# Patient Record
Sex: Female | Born: 1970 | Race: Black or African American | Hispanic: No | Marital: Single | Smoking: Former smoker
Health system: Southern US, Community
[De-identification: ages and names within clinical notes are randomized; demographics above are authoritative.]

## PROBLEM LIST (undated history)

## (undated) DIAGNOSIS — I82409 Acute embolism and thrombosis of unspecified deep veins of unspecified lower extremity: Secondary | ICD-10-CM

## (undated) DIAGNOSIS — M1990 Unspecified osteoarthritis, unspecified site: Secondary | ICD-10-CM

## (undated) DIAGNOSIS — I890 Lymphedema, not elsewhere classified: Secondary | ICD-10-CM

## (undated) DIAGNOSIS — I10 Essential (primary) hypertension: Secondary | ICD-10-CM

## (undated) DIAGNOSIS — Z9013 Acquired absence of bilateral breasts and nipples: Secondary | ICD-10-CM

## (undated) DIAGNOSIS — C7951 Secondary malignant neoplasm of bone: Secondary | ICD-10-CM

## (undated) DIAGNOSIS — C801 Malignant (primary) neoplasm, unspecified: Secondary | ICD-10-CM

## (undated) DIAGNOSIS — J45909 Unspecified asthma, uncomplicated: Secondary | ICD-10-CM

## (undated) DIAGNOSIS — C50911 Malignant neoplasm of unspecified site of right female breast: Secondary | ICD-10-CM

## (undated) DIAGNOSIS — Z1731 Human epidermal growth factor receptor 2 positive status: Secondary | ICD-10-CM

## (undated) HISTORY — PX: ABDOMINAL HYSTERECTOMY: SHX81

## (undated) HISTORY — PX: TUBAL LIGATION: SHX77

## (undated) HISTORY — PX: PORTA CATH INSERTION: CATH118285

## (undated) HISTORY — PX: MASTECTOMY: SHX3

## (undated) HISTORY — PX: BRONCHOSCOPY: SUR163

## (undated) NOTE — *Deleted (*Deleted)
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  Headache is a pressure feeling behind the right eye.  She was seen in the ED for syncope as well as a headache.  CT of head was negative.  Oncologist recommended she come in for MRI.  Exam is benign but there is significant tenderness over the right temporalis muscle on the insertion of the paracervical muscles.  Suspect headache is also contraction headache.  Blood pressure is noted to be elevated, but not high enough to account for headache.

---

## 2016-10-26 DIAGNOSIS — C7951 Secondary malignant neoplasm of bone: Secondary | ICD-10-CM | POA: Diagnosis present

## 2020-01-10 ENCOUNTER — Encounter (HOSPITAL_COMMUNITY): Payer: Self-pay

## 2020-01-10 ENCOUNTER — Emergency Department (HOSPITAL_COMMUNITY): Payer: Medicare Other

## 2020-01-10 ENCOUNTER — Other Ambulatory Visit: Payer: Self-pay

## 2020-01-10 ENCOUNTER — Emergency Department (HOSPITAL_COMMUNITY)
Admission: EM | Admit: 2020-01-10 | Discharge: 2020-01-11 | Disposition: A | Discharge status: Home / Self Care | Payer: Medicare Other | Attending: Emergency Medicine | Admitting: Emergency Medicine

## 2020-01-10 DIAGNOSIS — U071 COVID-19: Secondary | ICD-10-CM | POA: Insufficient documentation

## 2020-01-10 DIAGNOSIS — Z853 Personal history of malignant neoplasm of breast: Secondary | ICD-10-CM | POA: Insufficient documentation

## 2020-01-10 DIAGNOSIS — R7989 Other specified abnormal findings of blood chemistry: Secondary | ICD-10-CM | POA: Insufficient documentation

## 2020-01-10 DIAGNOSIS — R0981 Nasal congestion: Secondary | ICD-10-CM

## 2020-01-10 DIAGNOSIS — C7951 Secondary malignant neoplasm of bone: Secondary | ICD-10-CM | POA: Diagnosis not present

## 2020-01-10 DIAGNOSIS — R509 Fever, unspecified: Secondary | ICD-10-CM | POA: Diagnosis present

## 2020-01-10 HISTORY — DX: Acquired absence of bilateral breasts and nipples: Z90.13

## 2020-01-10 HISTORY — DX: Lymphedema, not elsewhere classified: I89.0

## 2020-01-10 HISTORY — DX: Malignant (primary) neoplasm, unspecified: C80.1

## 2020-01-10 HISTORY — DX: Secondary malignant neoplasm of bone: C79.51

## 2020-01-10 HISTORY — DX: Acute embolism and thrombosis of unspecified deep veins of unspecified lower extremity: I82.409

## 2020-01-10 HISTORY — DX: Human epidermal growth factor receptor 2 positive status: Z17.31

## 2020-01-10 HISTORY — DX: Malignant neoplasm of unspecified site of right female breast: C50.911

## 2020-01-10 LAB — COMPREHENSIVE METABOLIC PANEL
ALT: 15 U/L (ref 0–44)
AST: 25 U/L (ref 15–41)
Albumin: 3.9 g/dL (ref 3.5–5.0)
Alkaline Phosphatase: 60 U/L (ref 38–126)
Anion gap: 9 (ref 5–15)
BUN: 10 mg/dL (ref 6–20)
CO2: 25 mmol/L (ref 22–32)
Calcium: 8.9 mg/dL (ref 8.9–10.3)
Chloride: 103 mmol/L (ref 98–111)
Creatinine, Ser: 0.91 mg/dL (ref 0.44–1.00)
GFR calc Af Amer: >60 mL/min (ref >60)
GFR calc non Af Amer: >60 mL/min (ref >60)
Glucose, Bld: 98 mg/dL (ref 70–99)
Potassium: 3.5 mmol/L (ref 3.5–5.1)
Sodium: 137 mmol/L (ref 135–145)
Total Bilirubin: 0.4 mg/dL (ref 0.3–1.2)
Total Protein: 7.8 g/dL (ref 6.5–8.1)

## 2020-01-10 LAB — LACTIC ACID, PLASMA: Lactic Acid, Venous: 0.8 mmol/L (ref 0.5–1.9)

## 2020-01-10 LAB — CBC WITH DIFFERENTIAL/PLATELET
Abs Immature Granulocytes: 0.02 10*3/uL (ref 0.00–0.07)
Basophils Absolute: 0 10*3/uL (ref 0.0–0.1)
Basophils Relative: 0 %
Eosinophils Absolute: 0 10*3/uL (ref 0.0–0.5)
Eosinophils Relative: 1 %
HCT: 35.9 % — ABNORMAL LOW (ref 36.0–46.0)
Hemoglobin: 11.8 g/dL — ABNORMAL LOW (ref 12.0–15.0)
Immature Granulocytes: 0 %
Lymphocytes Relative: 32 %
Lymphs Abs: 1.9 10*3/uL (ref 0.7–4.0)
MCH: 28.6 pg (ref 26.0–34.0)
MCHC: 32.9 g/dL (ref 30.0–36.0)
MCV: 87.1 fL (ref 80.0–100.0)
Monocytes Absolute: 0.3 10*3/uL (ref 0.1–1.0)
Monocytes Relative: 4 %
Neutro Abs: 3.7 10*3/uL (ref 1.7–7.7)
Neutrophils Relative %: 63 %
Platelets: 292 10*3/uL (ref 150–400)
RBC: 4.12 MIL/uL (ref 3.87–5.11)
RDW: 12.4 % (ref 11.5–15.5)
WBC: 6 10*3/uL (ref 4.0–10.5)
nRBC: 0 % (ref 0.0–0.2)

## 2020-01-10 LAB — FERRITIN: Ferritin: 173 ng/mL (ref 11–307)

## 2020-01-10 LAB — TRIGLYCERIDES: Triglycerides: 80 mg/dL (ref <150)

## 2020-01-10 LAB — C-REACTIVE PROTEIN: CRP: 0.9 mg/dL (ref <1.0)

## 2020-01-10 LAB — D-DIMER, QUANTITATIVE: D-Dimer, Quant: 0.77 ug/mL-FEU — ABNORMAL HIGH (ref 0.00–0.50)

## 2020-01-10 LAB — I-STAT BETA HCG BLOOD, ED (MC, WL, AP ONLY): I-stat hCG, quantitative: <5 m[IU]/mL (ref <5)

## 2020-01-10 LAB — LACTATE DEHYDROGENASE: LDH: 184 U/L (ref 98–192)

## 2020-01-10 LAB — FIBRINOGEN: Fibrinogen: 412 mg/dL (ref 210–475)

## 2020-01-10 IMAGING — DX DG CHEST 1V PORT
1 series · 1 of 1 positions shown · non-contrast
Comparison: None.

CLINICAL DATA: Shortness of breath. COVID positive.

EXAM:
PORTABLE CHEST 1 VIEW

[chest ap]
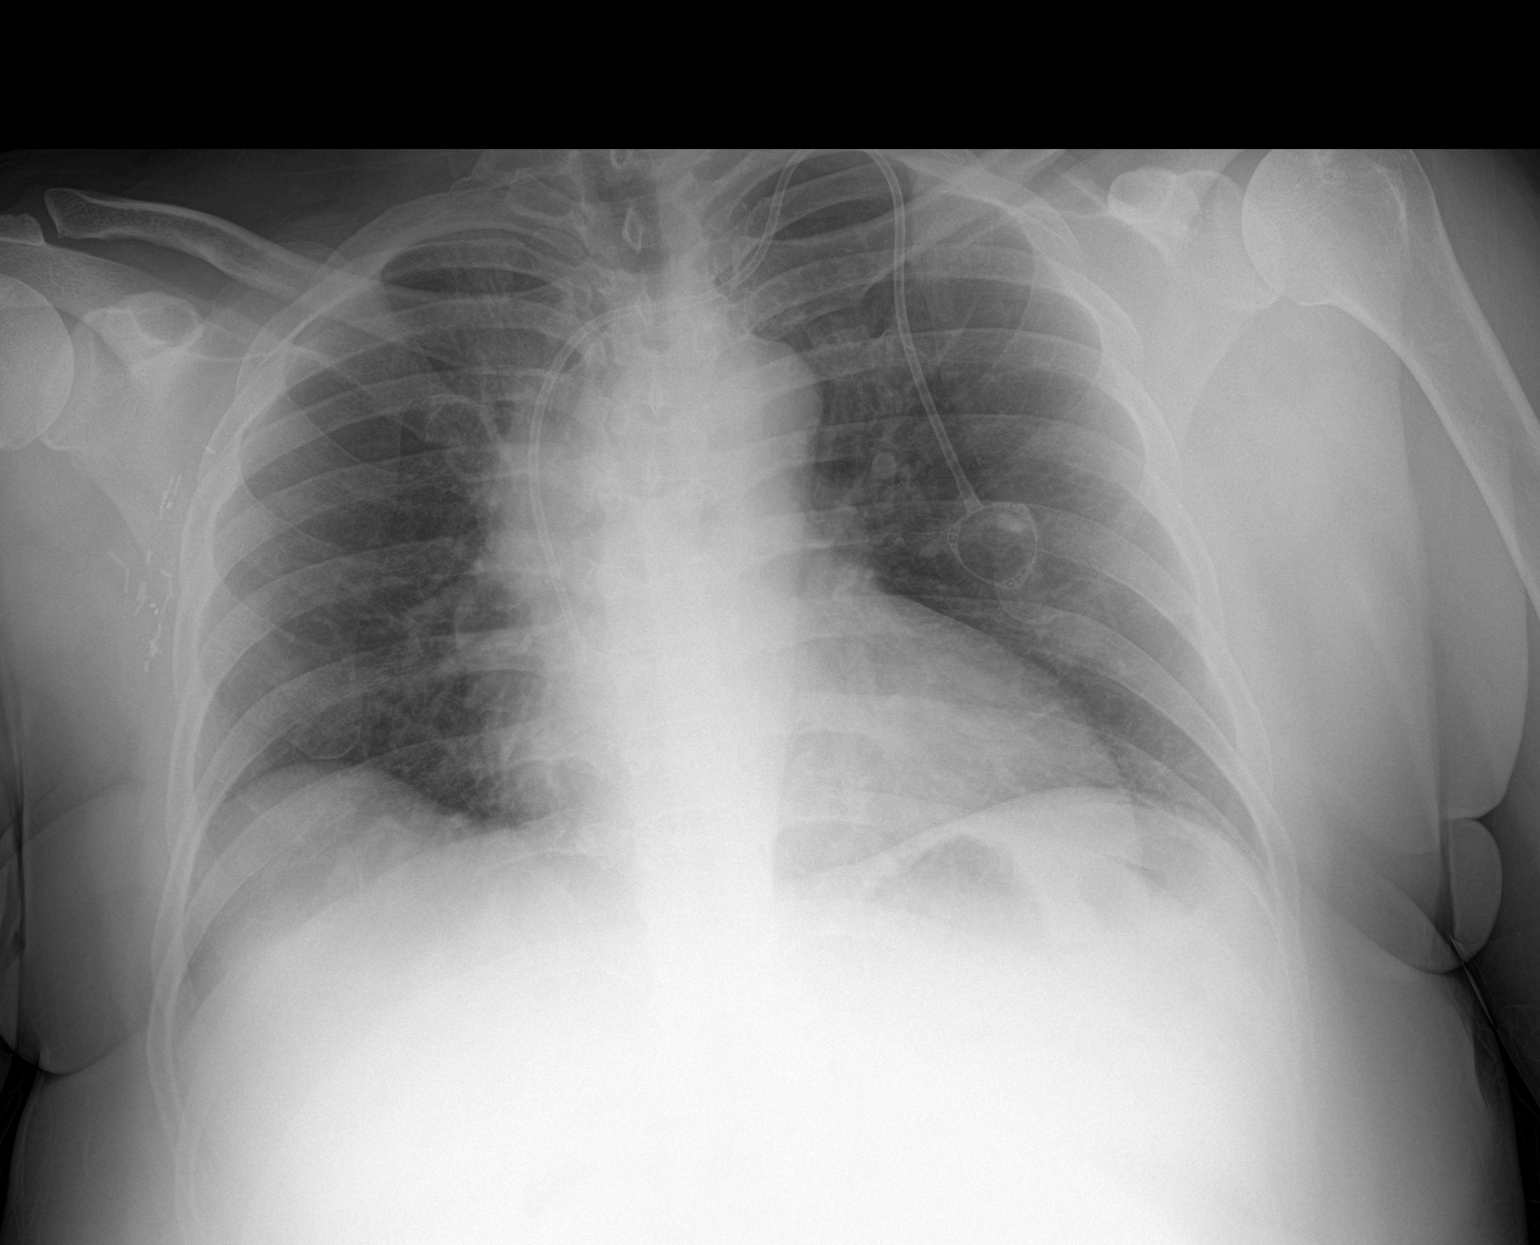

[1 of 1 positions shown; findings below may reference images not displayed]

FINDINGS: A left-sided venous Port-A-Cath is seen with its distal tip noted at
the junction of the superior vena cava and right atrium. There is no
evidence of acute infiltrate, pleural effusion or pneumothorax. The
heart size is within normal limits. There is widening of the
superior mediastinum along the suprahilar region on the right.
Radiopaque surgical clips are seen overlying the right axilla. The
visualized skeletal structures are unremarkable.
IMPRESSION: 1. No evidence of acute cardiopulmonary disease.
2. Widening of the superior mediastinum on the right, as described
above. Correlation with nonemergent chest CT is recommended, as
underlying mediastinal pathology cannot be excluded.

## 2020-01-10 NOTE — ED Provider Notes (Signed)
[facility] DEPT Provider Note   CSN: [id number] Arrival date & time: 01/10/20  2049     History Chief Complaint  Patient presents with  . Fever    covid +  . Cough    Maria Monroe is a 49 y.o. female.  She has a history of breast cancer with metastatic disease to bone.  She is active chemo through [facility].  She visited her mother a week ago and her mother had URI symptoms.  Mother ultimately tested positive for Covid so the patient had a test done on the seventh that was positive.  She had no symptoms until today when she had a fever of 100.0, nasal congestion and a dry cough.  Does not feel short of breath no nausea vomiting diarrhea lack of smell or taste.  She is here at the request of her oncologist.  She is just recently moved to the [place] area.  History of sinus problems  The history is provided by the patient.  Fever Max temp prior to arrival:  100.0 Temp source:  Oral Severity:  Mild Onset quality:  Unable to specify Progression:  Unchanged Chronicity:  New Relieved by:  None tried Worsened by:  Nothing Ineffective treatments:  None tried Associated symptoms: congestion, cough and rhinorrhea   Associated symptoms: no chest pain, no chills, no confusion, no diarrhea, no dysuria, no ear pain, no headaches, no myalgias, no nausea, no rash, no somnolence, no sore throat and no vomiting   Risk factors: immunosuppression, recent travel and sick contacts   Cough Associated symptoms: fever and rhinorrhea   Associated symptoms: no chest pain, no chills, no ear pain, no headaches, no myalgias, no rash, no shortness of breath and no sore throat        Past Medical History:  Diagnosis Date  . Bone metastasis ([place])   . Cancer [facility])    overlapping sites of right breast  . DVT (deep venous thrombosis) ([place])   . H/O bilateral mastectomy   . HER2-positive carcinoma of right breast ([place])   . Lymphedema of both arms     There are no  problems to display for this patient.   History reviewed. No pertinent surgical history.   OB History   No obstetric history on file.     No family history on file.  Social History   Tobacco Use  . Smoking status: Not on file  Substance Use Topics  . Alcohol use: Not on file  . Drug use: Not on file    Home Medications Prior to Admission medications   Not on File    Allergies    Aspirin, Tomato, Citric acid, and Nabumetone  Review of Systems   Review of Systems  Constitutional: Positive for fever. Negative for chills.  HENT: Positive for congestion, rhinorrhea and sneezing. Negative for ear pain and sore throat.   Eyes: Negative for visual disturbance.  Respiratory: Positive for cough. Negative for shortness of breath.   Cardiovascular: Negative for chest pain.  Gastrointestinal: Negative for abdominal pain, diarrhea, nausea and vomiting.  Genitourinary: Negative for dysuria.  Musculoskeletal: Negative for myalgias.  Skin: Negative for rash.  Neurological: Negative for headaches.  Psychiatric/Behavioral: Negative for confusion.    Physical Exam Updated Vital Signs BP 117/85   Pulse (!) 105   Temp 99.8 F (37.7 C) (Oral)   Resp 19   SpO2 94%   Physical Exam Vitals and nursing note reviewed.  Constitutional:      General: She  is not in acute distress.    Appearance: Normal appearance. She is well-developed.  HENT:     Head: Normocephalic and atraumatic.  Eyes:     Conjunctiva/sclera: Conjunctivae normal.  Cardiovascular:     Rate and Rhythm: Normal rate and regular rhythm.     Heart sounds: No murmur.  Pulmonary:     Effort: Pulmonary effort is normal. No respiratory distress.     Breath sounds: Normal breath sounds.  Abdominal:     Palpations: Abdomen is soft.     Tenderness: There is no abdominal tenderness.  Musculoskeletal:        General: No deformity or signs of injury. Normal range of motion.     Cervical back: Neck supple.  Skin:     General: Skin is warm and dry.     Capillary Refill: Capillary refill takes less than 2 seconds.  Neurological:     General: No focal deficit present.     Mental Status: She is alert.     ED Results / Procedures / Treatments   Labs (all labs ordered are listed, but only abnormal results are displayed) Labs Reviewed  CBC WITH DIFFERENTIAL/PLATELET - Abnormal; Notable for the following components:      Result Value   Hemoglobin 11.8 (*)    HCT 35.9 (*)    All other components within normal limits  D-DIMER, QUANTITATIVE (NOT AT [facility]) - Abnormal; Notable for the following components:   D-Dimer, Quant 0.77 (*)    All other components within normal limits  CULTURE, BLOOD (ROUTINE X 2)  CULTURE, BLOOD (ROUTINE X 2)  LACTIC ACID, PLASMA  COMPREHENSIVE METABOLIC PANEL  PROCALCITONIN  LACTATE DEHYDROGENASE  FERRITIN  TRIGLYCERIDES  FIBRINOGEN  C-REACTIVE PROTEIN  I-STAT BETA HCG BLOOD, ED ([facility], [facility], [facility] ONLY)    EKG EKG Interpretation  Date/Time:  Tuesday Jan 10 2020 22:23:53 EDT Ventricular Rate:  91 PR Interval:    QRS Duration: 89 QT Interval:  337 QTC Calculation: 415 R Axis:   -9 Text Interpretation: Sinus rhythm Left ventricular hypertrophy Borderline T abnormalities, lateral leads No old tracing to compare Confirmed by Aletta Edouard [phone]) on 01/10/2020 10:38:02 PM   Radiology DG Chest Port 1 View  Result Date: 01/10/2020 CLINICAL DATA:  Shortness of breath. COVID positive. EXAM: PORTABLE CHEST 1 VIEW COMPARISON:  None. FINDINGS: A left-sided venous Port-A-Cath is seen with its distal tip noted at the junction of the superior vena cava and right atrium. There is no evidence of acute infiltrate, pleural effusion or pneumothorax. The heart size is within normal limits. There is widening of the superior mediastinum along the suprahilar region on the right. Radiopaque surgical clips are seen overlying the right axilla. The visualized skeletal structures are unremarkable.  IMPRESSION: 1. No evidence of acute cardiopulmonary disease. 2. Widening of the superior mediastinum on the right, as described above. Correlation with nonemergent chest CT is recommended, as underlying mediastinal pathology cannot be excluded. Electronically Signed   By: Virgina Norfolk M.D.   On: 01/10/2020 22:09    Procedures Procedures (including critical care time)  Medications Ordered in ED Medications  predniSONE (DELTASONE) tablet 60 mg (60 mg Oral Given 01/11/20 0034)  azithromycin (ZITHROMAX) tablet 500 mg (500 mg Oral Given 01/11/20 0034)  heparin lock flush 100 unit/mL (500 Units Intracatheter Given 01/11/20 0037)    ED Course  I have reviewed the triage vital signs and the nursing notes.  Pertinent labs & imaging results that were available during my care of  the patient were reviewed by me and considered in my medical decision making (see chart for details).  Clinical Course as of Jan 10 1254  Tue Jan 10, 2020  2213 Chest x-ray interpreted by me as no gross infiltrates.  Radiology does comment upon some mediastinal widening and need for outpatient follow-up CT.   [MB]  Wed Jan 11, 2020  0018 Gust with [facility] oncologist Dr. Baltazar Najjar covering Dr. Dawayne Patricia.  She agrees that the patient does need to be admitted and is okay with [place] starting her on steroids and Zithromax.  She said it would also be appropriate if she can get the monoclonal antibody so I will put a referral in for that.   [MB]  0020 Reviewed results with patient.  She is comfortable with plan.  Return instructions discussed.   [MB]    Clinical Course User Index [MB] Hayden Rasmussen, MD   MDM Rules/Calculators/A&P                     Maria Monroe was evaluated in Emergency Department on 01/10/2020 for the symptoms described in the history of present illness. She was evaluated in the context of the global COVID-19 pandemic, which necessitated consideration that the patient might be at risk for infection with the  SARS-CoV-2 virus that causes COVID-19. Institutional protocols and algorithms that pertain to the evaluation of patients at risk for COVID-19 are in a state of rapid change based on information released by regulatory bodies including the CDC and federal and state organizations. These policies and algorithms were followed during the patient's care in the ED.  This patient complains of fever and recent Covid diagnosis; this involves an extensive number of treatment Options and is a complaint that carries with it a high risk of complications and Morbidity. The differential includes Covid infection, pneumonia, sinusitis, neutropenia, metabolic derangement   I ordered, reviewed and interpreted labs, which included normal white count of 6 no neutropenia, low hemoglobin 11.8, normal chemistries normal LFTs, elevated D-dimer unclear significance, normal lactate and inflammatory markers I ordered medication azithromycin and prednisone I ordered imaging studies which included chest x-ray and I independently    visualized and interpreted imaging which showed no acute infiltrates Previous records obtained and reviewed in epic including past oncology note at [facility] I consulted Dr. Lynden Ang oncology and discussed lab and imaging findings  Critical Interventions: None  After the interventions stated above, I reevaluated the patient and found patient to be stable and essentially asymptomatic.  We reviewed her work-up and plan for steroids antibiotics and close follow-up.  Return instructions discussed.  Placed consult to transitions of care for possible monoclonal antibody outpatient follow-up.   Final Clinical Impression(s) / ED Diagnoses Final diagnoses:  JJOAC-16 virus infection  Sinus congestion    Rx / DC Orders ED Discharge Orders         Ordered    predniSONE (DELTASONE) 20 MG tablet  Daily     01/11/20 0031    azithromycin (ZITHROMAX) 250 MG tablet  Daily     01/11/20 0031             Hayden Rasmussen, MD 01/11/20 1258

## 2020-01-10 NOTE — ED Notes (Signed)
Port accessed with blood return. Flushed with 10 ml normal saline. Site is clean and dry. Pt has transparent dressing.

## 2020-01-10 NOTE — ED Triage Notes (Addendum)
Pt coming from home c/o fever of 100.0, cough with white sputum and nasal congestion. Pt is a cancer pt with right arm restriction. Did not take tylenol. Sent by [facility] for labs. covid + as of 01/06/20

## 2020-01-11 ENCOUNTER — Encounter (HOSPITAL_COMMUNITY): Payer: Self-pay | Admitting: Emergency Medicine

## 2020-01-11 DIAGNOSIS — U071 COVID-19: Secondary | ICD-10-CM | POA: Diagnosis not present

## 2020-01-11 LAB — PROCALCITONIN: Procalcitonin: <0.1 ng/mL

## 2020-01-11 MED ORDER — HEPARIN SOD (PORK) LOCK FLUSH 100 UNIT/ML IV SOLN
500.0000 [IU] | Freq: Once | INTRAVENOUS | Status: AC
  Administered 2020-01-11: 500 [IU]
  Filled 2020-01-11: qty 5

## 2020-01-11 MED ORDER — PREDNISONE 20 MG PO TABS
60.0000 mg | ORAL_TABLET | Freq: Every day | ORAL | 0 refills | Status: DC
Start: 2020-01-11 — End: 2020-06-09

## 2020-01-11 MED ORDER — AZITHROMYCIN 250 MG PO TABS
500.0000 mg | ORAL_TABLET | Freq: Once | ORAL | Status: AC
  Administered 2020-01-11: 500 mg via ORAL
  Filled 2020-01-11: qty 2

## 2020-01-11 MED ORDER — AZITHROMYCIN 250 MG PO TABS
250.0000 mg | ORAL_TABLET | Freq: Every day | ORAL | 0 refills | Status: DC
Start: 2020-01-11 — End: 2020-06-09

## 2020-01-11 MED ORDER — PREDNISONE 20 MG PO TABS
60.0000 mg | ORAL_TABLET | Freq: Once | ORAL | Status: AC
  Administered 2020-01-11: 60 mg via ORAL
  Filled 2020-01-11: qty 3

## 2020-01-11 NOTE — ED Notes (Signed)
Port was de-accessed with Heparin. Site is clean and dry.

## 2020-01-11 NOTE — Discharge Instructions (Signed)
You were seen in the emergency department for evaluation of a fever in the setting of a new Covid infection.  You had a chest x-ray and lab test that did not show any acute complications.  After discussion with your oncology team we are putting you on prednisone and Zithromax for 4 more days.  Please keep in close contact with them.  You should isolate until your symptoms are resolved which may take up to 2 weeks.  Return to the emergency department for any acute worsening symptoms.  Your Chest Xray did have an abnormality and I have included the report below.  You will need to discuss this with your doctors to see if this was already identified prior.  MPRESSION:  1. No evidence of acute cardiopulmonary disease.  2. Widening of the superior mediastinum on the right, as described  above. Correlation with nonemergent chest CT is recommended, as  underlying mediastinal pathology cannot be excluded.

## 2020-01-11 NOTE — ED Notes (Signed)
Pt removed BP cuff. Pt can not tolerate BP cuff inflating over IV site due to pain.

## 2020-01-16 LAB — CULTURE, BLOOD (ROUTINE X 2)
Culture: NO GROWTH
Culture: NO GROWTH
Special Requests: ADEQUATE
Special Requests: ADEQUATE

## 2020-04-05 ENCOUNTER — Encounter: Payer: Self-pay | Admitting: Rehabilitation

## 2020-04-05 ENCOUNTER — Ambulatory Visit: Payer: Medicare Other | Attending: Nurse Practitioner | Admitting: Rehabilitation

## 2020-04-05 ENCOUNTER — Other Ambulatory Visit: Payer: Self-pay

## 2020-04-05 DIAGNOSIS — M79629 Pain in unspecified upper arm: Secondary | ICD-10-CM

## 2020-04-05 DIAGNOSIS — I972 Postmastectomy lymphedema syndrome: Secondary | ICD-10-CM | POA: Diagnosis present

## 2020-04-05 DIAGNOSIS — Z483 Aftercare following surgery for neoplasm: Secondary | ICD-10-CM | POA: Diagnosis present

## 2020-04-05 NOTE — Patient Instructions (Addendum)
*  only if clear from DVT Deep Effective Breath   Standing, sitting, or laying down, place both hands on the belly. Take a deep breath IN, expanding the belly; then breath OUT, contracting the belly. Repeat __5__ times. Do __2-3__ sessions per day and before your self massage.  http://gt2.exer.us/866    Copyright  VHI. All rights reserved.  Axilla to Inguinal Nodes - Sweep   On involved side, make 5 circles at groin at panty line,   then pump _5__ times from armpit along side of trunk to outer hip, making your other pathway.     Arm Posterior: Elbow to Shoulder - Sweep   Pump _5__ times from back of elbow to top of shoulder.     Copyright  VHI. All rights reserved.  ARM: [place]olar Wrist to Elbow - Sweep   Pump or stationary circles _5__ times from wrist to elbow making sure to do both sides of the forearm. Then retrace your steps to the outer arm, and the pathways _2-3_ times each. Do _1__ time per day.  Copyright  VHI. All rights reserved.  ARM: Dorsum of Hand to Shoulder - Sweep   Pump or stationary circles _5__ times on back of hand including knuckle spaces and individual fingers if needed working up towards the wrist, then retrace all your steps working back up the forearm, doing both sides; upper outer arm and back to your pathways _2-3_ times each. Then do 5 circles again at uninvolved armpit and involved groin where you started! Good job!! Do __1_ time per day.  Copyright  VHI. All rights reserved.
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
## 2020-04-24 ENCOUNTER — Other Ambulatory Visit: Payer: Self-pay

## 2020-04-24 ENCOUNTER — Ambulatory Visit: Payer: Medicare Other

## 2020-04-24 ENCOUNTER — Telehealth: Payer: Self-pay | Admitting: General Practice

## 2020-04-24 DIAGNOSIS — I972 Postmastectomy lymphedema syndrome: Secondary | ICD-10-CM

## 2020-04-24 DIAGNOSIS — Z483 Aftercare following surgery for neoplasm: Secondary | ICD-10-CM

## 2020-04-24 DIAGNOSIS — M79629 Pain in unspecified upper arm: Secondary | ICD-10-CM

## 2020-04-24 NOTE — Telephone Encounter (Signed)
°   Philis Doke DOB: 09/19/1970 MRN: [id number]   RIDER WAIVER AND RELEASE OF LIABILITY  For purposes of improving physical access to our facilities, [facility] is pleased to partner with third parties to provide [place] patients or other authorized individuals the option of convenient, on-demand ground transportation services (the [employer]) through use of the technology service that enables users to request on-demand ground transportation from independent third-party providers.  By opting to use and accept these [employer], I, the undersigned, hereby agree on behalf of myself, and on behalf of any minor child using the [employer] for whom I am the parent or legal guardian, as follows:  1. Government social research officer provided to me are provided by independent third-party transportation providers who are not [employer] or employees and who are unaffiliated with [employer]. 2. [employer] is neither a transportation carrier nor a common or public carrier. 3. [facility] has no control over the quality or safety of the transportation that occurs as a result of the [employer]. 4. [facility] cannot guarantee that any third-party transportation provider will complete any arranged transportation service. 5. [facility] makes no representation, warranty, or guarantee regarding the reliability, timeliness, quality, safety, suitability, or availability of any of the Transport Services or that they will be error free. 6. I fully understand that traveling by vehicle involves risks and dangers of serious bodily injury, including permanent disability, paralysis, and death. I agree, on behalf of myself and on behalf of any minor child using the Transport Services for whom I am the parent or legal guardian, that the entire risk arising out of my use of the [employer] remains solely with me, to the maximum extent permitted under applicable law. 7. The [employer] are provided as is and as available. [facility] disclaims all representations and warranties, express, implied or statutory, not expressly set out in these terms, including the implied warranties of merchantability and fitness for a particular purpose. 8. I hereby waive and release [facility], its agents, employees, officers, directors, representatives, insurers, attorneys, assigns, successors, subsidiaries, and affiliates from any and all past, present, or future claims, demands, liabilities, actions, causes of action, or suits of any kind directly or indirectly arising from acceptance and use of the [employer]. 9. I further waive and release [facility] and its affiliates from all present and future liability and responsibility for any injury or death to persons or damages to property caused by or related to the use of the [employer]. 10. I have read this Waiver and Release of Liability, and I understand the terms used in it and their legal significance. This Waiver is freely and voluntarily given with the understanding that my right (as well as the right of any minor child for whom I am the parent or legal guardian using the [employer]) to legal recourse against [facility] in connection with the [employer] is knowingly surrendered in return for use of these services.   I attest that I read the consent document to Elenor Legato, gave Ms. Gotham the opportunity to ask questions and answered the questions asked (if any). I affirm that Kameka Whan then provided consent for she's participation in this program.     Drucie Ip

## 2020-04-24 NOTE — Therapy (Signed)
Maria [place], [place], [address] Phone: [phone]   Fax:  [phone]  Physical Therapy Treatment  Patient Details  Name: Maria Monroe MRN: [id number] Date of Birth: Mar 16, 1971 Referring Provider (PT): Maryla Morrow, NP   Encounter Date: 04/24/2020   PT End of Session - 04/24/20 1108    Visit Number 2    Number of Visits 13    Date for PT Re-Evaluation 06/05/20    Authorization Type medicare/medicaid    PT Start Time 1010    PT Stop Time 1108    PT Time Calculation (min) 58 min    Activity Tolerance Patient tolerated treatment well    Behavior During Therapy [facility] for tasks assessed/performed           Past Medical History:  Diagnosis Date  . Bone metastasis ([place])   . Cancer [facility])    overlapping sites of right breast  . DVT (deep venous thrombosis) ([place])   . H/O bilateral mastectomy   . HER2-positive carcinoma of right breast ([place])   . Lymphedema of both arms     No past surgical history on file.  There were no vitals filed for this visit.   Subjective Assessment - 04/24/20 1029    Subjective I brought my old compression shirt and sleeves for you to check out. I just always hurt.    Pertinent History History of inflammatory triple positive Rt breast cancer with neoadjuvant chemotherapy, Rt mastectomy with ALND on 04/01/2012 with 2/12 nodes positive. followed by radiation and additional chemotherapy due to chest wall invasion, dermal lymphatic invasion.  Total hysterectomy in 2014.  Lt mastectomy in 2015. Spinal bone metastases present with palliative radiation completed to C7 and T2 10 doses.  Recent PET showing mediastinal lymph node involvement.  Maintenance therapy of Herceptin and Perjeta every 3 weeks.  Sarcoidosis, mild restrictive lung disease    Patient Stated Goals decrease the swelling    Currently in Pain? Yes    Pain Score 8     Pain Location Chest    Pain Orientation  Left;Right    Pain Descriptors / Indicators Tingling;Sharp    Pain Type Neuropathic pain    Pain Radiating Towards into bil UE's    Pain Onset More than a month ago    Pain Frequency Constant    Aggravating Factors  being still for awhile    Pain Relieving Factors pain meds, incorrectly fitting compression                       Outpatient Re[facility]  Lymphedema Life Impact Scale Total Score 63.24 %            [facility] Adult PT Treatment/Exercise - 04/24/20 0001      Manual Therapy   Manual Therapy Manual Lymphatic Drainage (MLD);Myofascial release;Passive ROM;Edema management    Edema Management Issued long rectangle of komprex in large TG soft for pt to wear entire chest at axilla line under her compression shirt.     Myofascial Release To bil chest walls with cross hands technique vertical and horizontally, and to Rt axilla where increase myofascial tightness    Manual Lymphatic Drainage (MLD) In Supine: Short neck, superficial and deep abdominals, Lt inguinal nodes, Lt axillo-inguinal anastomosis, and then focused on Lt UE working from proximal to distal and retracing all steps beginning to instruct pt throughout, then same to Rt but trunk only due to time  Passive ROM To bil shoulders into flexion and abduction to tolerance                       PT Long Term Goals - 04/05/20 1108      PT LONG TERM GOAL #1   Title Pt will be ind with self MLD or pump use for lymphedema care    Time 6    Period Weeks    Status New      PT LONG TERM GOAL #3   Title Pt will decrease Lt UE volume by at least 290m    Baseline 32950m   Time 6    Period Weeks    Status New      PT LONG TERM GOAL #4   Title Pt will obtain compression garments for bil UEs and chest wall edema    Time 6    Period Weeks    Status New      PT LONG TERM GOAL #5   Title Pt will with visible reduction of Lt chest wall edema  by at least 50% per photographic evidence    Time 6    Period Weeks    Status New                 Plan - 04/24/20 1244    Clinical Impression Statement Pt was cleared for DVT's in all extremities so began MLD for LUQ/UE and Rt lateral trunk today. She seemed to tolerate this well. Also included P/ROM of bil shoulders. VCs during for relaxation due to guarded posture, but this was mildly so and pt did well. Brief STM to bil upper traps at end of session. Issued foam (see flowsheet) for pt to try wearing under compression shirt as this doesn't fit as well as pt reports it did when first received a few years ago. She reports this feeling very comfortable and gives her more compression at chest wall where she feels the most tenderness. Her compression sleeve actually fits well but pt reports causes tingling at wrist/hand.    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, sarcoidosis, possible DVT    Examination-Activity Limitations Lift    Examination-Participation Restrictions Cleaning;Laundry;Shop    Stability/Clinical Decision Making Evolving/Moderate complexity    Rehab Potential Good    PT Frequency 2x / week    PT Duration 6 weeks    PT Treatment/Interventions ADLs/Self Care Home Management;Therapeutic exercise;Patient/family education;Manual lymph drainage;Manual techniques;Compression bandaging;Taping;Passive range of motion;Scar mobilization    PT Next Visit Plan Assess how komprex did in compression shirt, MLD bil UE and chest, Lt.Rt include some Rt scapular region work, pt okayed for 100% compression coverage so schedule with Melissa/okayed for trial pump first, does she want to do this?    PT Home Exercise Plan Try wearing her compression shirt with komprex foam    Consulted and Agree with Plan of Care Patient           Patient will benefit from skilled therapeutic intervention in order to  improve the following deficits and impairments:  Increased fascial restricitons, Pain, Increased edema, Impaired UE functional use, Postural dysfunction, Decreased endurance  Visit Diagnosis: Post-mastectomy lymphedema syndrome  Pain in axilla, unspecified laterality  Aftercare following surgery for neoplasm     Problem List There are no problems to display for this patient.   RoOtelia LimesPTA 04/24/2020, 12:53 PM  [facility]  [place], [place], [address] Phone: [phone]   Fax:  [phone]  Name: Maria Monroe MRN: [id number] Date of Birth: Jan 16, 1971

## 2020-04-26 ENCOUNTER — Ambulatory Visit: Payer: Medicare Other

## 2020-04-26 ENCOUNTER — Other Ambulatory Visit: Payer: Self-pay

## 2020-04-26 DIAGNOSIS — I972 Postmastectomy lymphedema syndrome: Secondary | ICD-10-CM

## 2020-04-26 DIAGNOSIS — M79629 Pain in unspecified upper arm: Secondary | ICD-10-CM

## 2020-04-26 DIAGNOSIS — Z483 Aftercare following surgery for neoplasm: Secondary | ICD-10-CM

## 2020-04-26 NOTE — Patient Instructions (Signed)
Flexion (Eccentric) - Active-Assist (Cane)          Cancer Rehab [phone]    Use unaffected arm to push affected arm forward. Avoid hiking shoulder (shoulder should NOT touch cheek). Keep palm relaxed. Slowly lower affected arm. Hold stretch for _5_ seconds repeating _5-10_ times, _1-2_ times a day.  Abduction (Eccentric) - Active-Assist (Cane)    Use unaffected arm to push affected arm out to side. Avoid hiking shoulder (shoulder should NOT touch cheek). Keep palm relaxed. Slowly lower affected arm. Hold stretch _5_ seconds repeating _5-10_ times, _1-2_ times a day.    Cancer Rehab [phone]

## 2020-04-26 NOTE — Therapy (Signed)
[place], [place], [address] Phone: [phone]   Fax:  [phone]  Physical Therapy Treatment  Patient Details  Name: Maria Monroe MRN: [id number] Date of Birth: 1971-07-13 Referring Provider (PT): Maryla Morrow, NP   Encounter Date: 04/26/2020   PT End of Session - 04/26/20 1057    Visit Number 3    Number of Visits 13    Date for PT Re-Evaluation 06/05/20    Authorization Type medicare/medicaid    PT Start Time 0947    PT Stop Time 1051    PT Time Calculation (min) 64 min    Activity Tolerance Patient tolerated treatment well    Behavior During Therapy [facility] for tasks assessed/performed           Past Medical History:  Diagnosis Date  . Bone metastasis ([place])   . Cancer [facility])    overlapping sites of right breast  . DVT (deep venous thrombosis) ([place])   . H/O bilateral mastectomy   . HER2-positive carcinoma of right breast ([place])   . Lymphedema of both arms     History reviewed. No pertinent surgical history.  There were no vitals filed for this visit.   Subjective Assessment - 04/26/20 1000    Subjective I had some soreness in my armpits after the last session and noticed some tingling in my arms. I saw my heart doctor and she has me wearing a heart monitor for the next 24 hours, I take it off at 1500. The compression pump rep is coming to my house today for the demo.    Pertinent History History of inflammatory triple positive Rt breast cancer with neoadjuvant chemotherapy, Rt mastectomy with ALND on 04/01/2012 with 2/12 nodes positive. followed by radiation and additional chemotherapy due to chest wall invasion, dermal lymphatic invasion.  Total hysterectomy in 2014.  Lt mastectomy in 2015. Spinal bone metastases present with palliative radiation completed to C7 and T2 10 doses.  Recent PET showing mediastinal lymph node involvement.  Maintenance therapy of Herceptin and Perjeta every  3 weeks.  Sarcoidosis, mild restrictive lung disease    Patient Stated Goals decrease the swelling    Currently in Pain? Yes    Pain Score 7    7-8/10   Pain Location Chest    Pain Orientation Right;Left    Pain Descriptors / Indicators Tingling;Sharp    Pain Type Neuropathic pain    Pain Radiating Towards tingling into bil UE's    Pain Onset More than a month ago    Pain Frequency Constant    Aggravating Factors  being still for awhile    Pain Relieving Factors pain meds, incorrectly fitting compression                       Outpatient Rehab [facility]  Lymphedema Life Impact Scale Total Score 63.24 %            [facility] Adult PT Treatment/Exercise - 04/26/20 0001      Shoulder Exercises: Seated   Flexion AAROM;Right;Left;5 reps    Flexion Limitations Pt returned therapist demo and VCs to decrease scapular compensations    Abduction AAROM;Right;Left   2 reps each   ABduction Limitations VCs to relax shoulder being stretched/decrease scapular compensation      Manual Therapy   Manual therapy comments WORKED AROUND HEART ELECTRODES FOR ALL MANUAL     Myofascial Release To bil chest walls with  cross hands technique horizontally avoiding heart electrodes, and to bil axillae where increase myofascial tightness    Manual Lymphatic Drainage (MLD) In Supine: Short neck, superficial and deep abdominals, Lt inguinal nodes, Lt axillo-inguinal anastomosis,, then same to Rt side focusing on lateral trunk swelling for both    Passive ROM To bil shoulders into flexion and abduction to tolerance, Lt included D2 as able                  PT Education - 04/26/20 1101    Education Details Seated dowel exs    Person(s) Educated Patient    Methods Explanation;Demonstration;Handout    Comprehension Verbalized understanding;Returned demonstration;Need further instruction;Verbal cues required               PT Long  Term Goals - 04/05/20 1108      PT LONG TERM GOAL #1   Title Pt will be ind with self MLD or pump use for lymphedema care    Time 6    Period Weeks    Status New      PT LONG TERM GOAL #3   Title Pt will decrease Lt UE volume by at least 264m    Baseline 32952m   Time 6    Period Weeks    Status New      PT LONG TERM GOAL #4   Title Pt will obtain compression garments for bil UEs and chest wall edema    Time 6    Period Weeks    Status New      PT LONG TERM GOAL #5   Title Pt will with visible reduction of Lt chest wall edema by at least 50% per photographic evidence    Time 6    Period Weeks    Status New                 Plan - 04/26/20 1058    Clinical Impression Statement Was able to get one of pts appointments R/S next week to Friday, due to transpotation, so she can be measured for compression garments when fitter is here at that time. Pt comes in today wearing electrodes on anterior chest/trunk for heart monitoring so adjusted all manual to avoid these areas. Pts P/ROM was much improved today with less cuing required to relax.    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, sarcoidosis, possible DVT    Examination-Activity Limitations Lift    Examination-Participation Restrictions Cleaning;Laundry;Shop    Stability/Clinical Decision Making Evolving/Moderate complexity    Rehab Potential Good    PT Frequency 2x / week    PT Duration 6 weeks    PT Treatment/Interventions ADLs/Self Care Home Management;Therapeutic exercise;Patient/family education;Manual lymph drainage;Manual techniques;Compression bandaging;Taping;Passive range of motion;Scar mobilization    PT Next Visit Plan Assess how komprex did in compression shirt (couldn't wear much since last appt due to heart monitor),instruct and cont  MLD bil UE and chest issuing handout for this, Lt>Rt include some Rt scapular region  work    PT Home Exercise Plan Try wearing her compression shirt with komprex foam    Consulted and Agree with Plan of Care Patient           Patient will benefit from skilled therapeutic intervention in order to improve the following deficits and impairments:  Increased fascial restricitons, Pain, Increased edema, Impaired UE functional use, Postural dysfunction, Decreased endurance  Visit Diagnosis: Post-mastectomy lymphedema syndrome  Pain in axilla, unspecified laterality  Aftercare following surgery for neoplasm     Problem List There are no problems to display for this patient.   Otelia Limes, PTA 04/26/2020, 11:02 AM  [place], [place], [address] Phone: [phone]   Fax:  [phone]  Name: Maria Monroe MRN: [id number] Date of Birth: 04-26-1971

## 2020-05-01 ENCOUNTER — Other Ambulatory Visit: Payer: Self-pay

## 2020-05-01 ENCOUNTER — Encounter: Payer: Self-pay | Admitting: Rehabilitation

## 2020-05-01 ENCOUNTER — Ambulatory Visit: Payer: Medicare Other | Admitting: Rehabilitation

## 2020-05-01 DIAGNOSIS — M79629 Pain in unspecified upper arm: Secondary | ICD-10-CM

## 2020-05-01 DIAGNOSIS — Z483 Aftercare following surgery for neoplasm: Secondary | ICD-10-CM

## 2020-05-01 DIAGNOSIS — I972 Postmastectomy lymphedema syndrome: Secondary | ICD-10-CM | POA: Diagnosis not present

## 2020-05-01 NOTE — Therapy (Signed)
[place], [place], [address] Phone: [phone]   Fax:  [phone]  Physical Therapy Treatment  Patient Details  Name: Maria Monroe MRN: [id number] Date of Birth: 12/10/1970 Referring Provider (PT): Maryla Morrow, NP   Encounter Date: 05/01/2020   PT End of Session - 05/01/20 1658    Visit Number 4    Number of Visits 13    Date for PT Re-Evaluation 06/05/20    Authorization Type medicare/medicaid    PT Start Time 1600    PT Stop Time 1655    PT Time Calculation (min) 55 min    Activity Tolerance Patient tolerated treatment well    Behavior During Therapy [facility] for tasks assessed/performed           Past Medical History:  Diagnosis Date  . Bone metastasis ([place])   . Cancer [facility])    overlapping sites of right breast  . DVT (deep venous thrombosis) ([place])   . H/O bilateral mastectomy   . HER2-positive carcinoma of right breast ([place])   . Lymphedema of both arms     History reviewed. No pertinent surgical history.  There were no vitals filed for this visit.   Subjective Assessment - 05/01/20 1559    Subjective The foam is irritable but I have been wearing it .  The sleeve person came out and it was okay they will see if it is beneficial    Pertinent History History of inflammatory triple positive Rt breast cancer with neoadjuvant chemotherapy, Rt mastectomy with ALND on 04/01/2012 with 2/12 nodes positive. followed by radiation and additional chemotherapy due to chest wall invasion, dermal lymphatic invasion.  Total hysterectomy in 2014.  Lt mastectomy in 2015. Spinal bone metastases present with palliative radiation completed to C7 and T2 10 doses.  Recent PET showing mediastinal lymph node involvement.  Maintenance therapy of Herceptin and Perjeta every 3 weeks.  Sarcoidosis, mild restrictive lung disease    Patient Stated Goals decrease the swelling    Pain Score 5     Pain Location  Chest    Pain Orientation Right;Left    Pain Descriptors / Indicators Tightness    Pain Type Neuropathic pain;Chronic pain;Surgical pain    Pain Onset More than a month ago    Pain Frequency Constant                       Outpatient [facility]  Lymphedema Life Impact Scale Total Score 63.24 %            [facility] Adult PT Treatment/Exercise - 05/01/20 0001      Manual Therapy   Manual Lymphatic Drainage (MLD) In Supine: Short neck, superficial and deep abdominals, Lt inguinal nodes, Lt axillo-inguinal anastomosis, Rt chest and then Rt UE from proximal to distal, and then same to Rt side focusing on lateral trunk swelling for both, then in left sidelying Rt work to Rt scapular region towards groin.      Passive ROM To bil shoulders into flexion and abduction to tolerance                       PT Long Term Goals - 04/05/20 1108      PT LONG TERM GOAL #1   Title Pt will be ind with self MLD or pump use for lymphedema care    Time 6    Period Weeks  Status New      PT LONG TERM GOAL #3   Title Pt will decrease Lt UE volume by at least 257m    Baseline 32948m   Time 6    Period Weeks    Status New      PT LONG TERM GOAL #4   Title Pt will obtain compression garments for bil UEs and chest wall edema    Time 6    Period Weeks    Status New      PT LONG TERM GOAL #5   Title Pt will with visible reduction of Lt chest wall edema by at least 50% per photographic evidence    Time 6    Period Weeks    Status New                 Plan - 05/01/20 1658    Clinical Impression Statement Pt had flexitouch demo which she said was "different" but is willing to try if it helps.  Denied that the trial increased edema into the chest but reports it also did not feel any better.  MLD to bil chest and UEs as well as scapular region on the Rt with some attempted STM to the pectoralis on the Rt but  increased spasm present with palpation.  Pt returns friday for new garments    PT Frequency 2x / week    PT Duration 6 weeks    PT Treatment/Interventions ADLs/Self Care Home Management;Therapeutic exercise;Patient/family education;Manual lymph drainage;Manual techniques;Compression bandaging;Taping;Passive range of motion;Scar mobilization    PT Next Visit Plan instruct and cont  MLD bil UE and chest issuing handout for this, Lt>Rt include some Rt scapular region work    PT Home Exercise Plan Try wearing her compression shirt with komprex foam           Patient will benefit from skilled therapeutic intervention in order to improve the following deficits and impairments:     Visit Diagnosis: Post-mastectomy lymphedema syndrome  Pain in axilla, unspecified laterality  Aftercare following surgery for neoplasm     Problem List There are no problems to display for this patient.   TeStark Bray/31/2021, 5:[place]: [phone] Fax:  [phone]: Maria HawesRN: [id number] of Birth: 1101-12-1970

## 2020-05-04 ENCOUNTER — Other Ambulatory Visit: Payer: Self-pay

## 2020-05-04 ENCOUNTER — Ambulatory Visit: Payer: Medicare Other | Attending: Nurse Practitioner

## 2020-05-04 DIAGNOSIS — I972 Postmastectomy lymphedema syndrome: Secondary | ICD-10-CM | POA: Insufficient documentation

## 2020-05-04 DIAGNOSIS — M79629 Pain in unspecified upper arm: Secondary | ICD-10-CM

## 2020-05-04 DIAGNOSIS — Z483 Aftercare following surgery for neoplasm: Secondary | ICD-10-CM | POA: Diagnosis present

## 2020-05-04 NOTE — Therapy (Signed)
[place], [place], [address] Phone: [phone]   Fax:  [phone]  Physical Therapy Treatment  Patient Details  Name: Maria Monroe MRN: [id number] Date of Birth: August 11, 1971 Referring Provider (PT): Maryla Morrow, NP   Encounter Date: 05/04/2020   PT End of Session - 05/04/20 1309    Visit Number 5    Number of Visits 13    Date for PT Re-Evaluation 06/05/20    Authorization Type medicare/medicaid    PT Start Time 1206    PT Stop Time 1308    PT Time Calculation (min) 62 min    Activity Tolerance Patient tolerated treatment well    Behavior During Therapy [facility] for tasks assessed/performed           Past Medical History:  Diagnosis Date  . Bone metastasis ([place])   . Cancer [facility])    overlapping sites of right breast  . DVT (deep venous thrombosis) ([place])   . H/O bilateral mastectomy   . HER2-positive carcinoma of right breast ([place])   . Lymphedema of both arms     History reviewed. No pertinent surgical history.  There were no vitals filed for this visit.   Subjective Assessment - 05/04/20 1213    Subjective I've been trying to wear the foam but I just think that accident really messed everything up and I'm just going to have to deal with it.    Pertinent History History of inflammatory triple positive Rt breast cancer with neoadjuvant chemotherapy, Rt mastectomy with ALND on 04/01/2012 with 2/12 nodes positive. followed by radiation and additional chemotherapy due to chest wall invasion, dermal lymphatic invasion.  Total hysterectomy in 2014.  Lt mastectomy in 2015. Spinal bone metastases present with palliative radiation completed to C7 and T2 10 doses.  Recent PET showing mediastinal lymph node involvement.  Maintenance therapy of Herceptin and Perjeta every 3 weeks.  Sarcoidosis, mild restrictive lung disease    Patient Stated Goals decrease the swelling    Currently in Pain? Yes     Pain Score 7     Pain Location Chest    Pain Orientation Right;Left    Pain Descriptors / Indicators Tingling;Sore    Pain Type Chronic pain;Surgical pain;Neuropathic pain    Pain Onset More than a month ago    Pain Frequency Constant    Aggravating Factors  being still for awhile    Pain Relieving Factors pain meds, incorrectly fitting compression              [facility] PT Assessment - 05/04/20 0001      AROM   Right Shoulder Extension 43 Degrees    Right Shoulder Flexion 125 Degrees    Right Shoulder ABduction 126 Degrees    Left Shoulder Extension 56 Degrees    Left Shoulder Flexion 127 Degrees    Left Shoulder ABduction 113 Degrees                   Outpatient [facility]  Lymphedema Life Impact Scale Total Score 63.24 %            [facility] Adult PT Treatment/Exercise - 05/04/20 0001      Manual Therapy   Manual Lymphatic Drainage (MLD) In Supine: Short neck, superficial and deep abdominals, Lt inguinal nodes, Lt axillo-inguinal anastomosis, Lt chest and then Lt UE from proximal to distal, and then same to Rt side focusing on lateral trunk swelling.  Passive ROM To bil shoulders into flexion, abduction and D2 to tolerance                       PT Long Term Goals - 04/05/20 1108      PT LONG TERM GOAL #1   Title Pt will be ind with self MLD or pump use for lymphedema care    Time 6    Period Weeks    Status New      PT LONG TERM GOAL #3   Title Pt will decrease Lt UE volume by at least 265m    Baseline 32948m   Time 6    Period Weeks    Status New      PT LONG TERM GOAL #4   Title Pt will obtain compression garments for bil UEs and chest wall edema    Time 6    Period Weeks    Status New      PT LONG TERM GOAL #5   Title Pt will with visible reduction of Lt chest wall edema by at least 50% per photographic evidence    Time 6    Period Weeks    Status New                  Plan - 05/04/20 1310    Clinical Impression Statement Pt was measured for new compression garments before session today [place] f[place] with focus on manual therapy working to reduce her tissue tightness at bil shoulders. She has noticeable improved end P/ROM now than from previous sessions and pt reports being able to notice same. Her A/ROM for bil shoulders is also much improved since last time measured. Pt is progressing well towards goals. She continues to present with increased swelling at Lt anterior chest, though hasn't had good compression to wear here yet. Hopeful this improves once new compression arrives.    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, sarcoidosis, possible DVT    Examination-Activity Limitations Lift    Examination-Participation Restrictions Cleaning;Laundry;Shop    Stability/Clinical Decision Making Evolving/Moderate complexity    Rehab Potential Good    PT Frequency 2x / week    PT Duration 6 weeks    PT Treatment/Interventions ADLs/Self Care Home Management;Therapeutic exercise;Patient/family education;Manual lymph drainage;Manual techniques;Compression bandaging;Taping;Passive range of motion;Scar mobilization    PT Next Visit Plan instruct and cont  MLD bil UE and chest issuing handout for this, try supine scapular series next and issue to HEP if pt tolerates well; Lt>Rtshoulder ROM    PT Home Exercise Plan Try wearing her compression shirt with komprex foam    Consulted and Agree with Plan of Care Patient           Patient will benefit from skilled therapeutic intervention in order to improve the following deficits and impairments:  Increased fascial restricitons, Pain, Increased edema, Impaired UE functional use, Postural dysfunction, Decreased endurance  Visit Diagnosis: Post-mastectomy lymphedema syndrome  Pain in axilla,  unspecified laterality  Aftercare following surgery for neoplasm     Problem List There are no problems to display for this patient.   RoOtelia LimesPTA 05/04/2020, 1:23 PM  [id number]: [phone] Fax:  [phone]: Maria OppenheimerRN: [id number] of Birth: 11May 30, 1972

## 2020-05-08 ENCOUNTER — Other Ambulatory Visit: Payer: Self-pay

## 2020-05-08 ENCOUNTER — Ambulatory Visit: Payer: Medicare Other | Admitting: Rehabilitation

## 2020-05-08 ENCOUNTER — Encounter: Payer: Self-pay | Admitting: Rehabilitation

## 2020-05-08 DIAGNOSIS — Z483 Aftercare following surgery for neoplasm: Secondary | ICD-10-CM

## 2020-05-08 DIAGNOSIS — I972 Postmastectomy lymphedema syndrome: Secondary | ICD-10-CM | POA: Diagnosis not present

## 2020-05-08 DIAGNOSIS — M79629 Pain in unspecified upper arm: Secondary | ICD-10-CM

## 2020-05-08 NOTE — Therapy (Signed)
[place], [place], [address] Phone: [phone]   Fax:  [phone]  Physical Therapy Treatment  Patient Details  Name: Maria Monroe MRN: [id number] Date of Birth: 1971/04/27 Referring Provider (PT): Maryla Morrow, NP   Encounter Date: 05/08/2020   PT End of Session - 05/08/20 1051    Visit Number 6    Number of Visits 13    Date for PT Re-Evaluation 06/05/20    Authorization Type medicare/medicaid    PT Start Time 0955    PT Stop Time 1048    PT Time Calculation (min) 53 min    Activity Tolerance Patient tolerated treatment well    Behavior During Therapy [facility] for tasks assessed/performed           Past Medical History:  Diagnosis Date  . Bone metastasis ([place])   . Cancer [facility])    overlapping sites of right breast  . DVT (deep venous thrombosis) ([place])   . H/O bilateral mastectomy   . HER2-positive carcinoma of right breast ([place])   . Lymphedema of both arms     History reviewed. No pertinent surgical history.  There were no vitals filed for this visit.   Subjective Assessment - 05/08/20 0951    Subjective Still swollen.  No more sharp pain.  I am improving my ROM at home  but maybe making myself get too many spasms.    Pertinent History History of inflammatory triple positive Rt breast cancer with neoadjuvant chemotherapy, Rt mastectomy with ALND on 04/01/2012 with 2/12 nodes positive. followed by radiation and additional chemotherapy due to chest wall invasion, dermal lymphatic invasion.  Total hysterectomy in 2014.  Lt mastectomy in 2015. Spinal bone metastases present with palliative radiation completed to C7 and T2 10 doses.  Recent PET showing mediastinal lymph node involvement.  Maintenance therapy of Herceptin and Perjeta every 3 weeks.  Sarcoidosis, mild restrictive lung disease    Currently in Pain? Yes    Pain Score 5     Pain Location Chest    Pain Orientation Right;Left     Pain Descriptors / Indicators Aching;Burning    Pain Type Chronic pain;Neuropathic pain    Pain Onset More than a month ago    Pain Frequency Constant                       Outpatient Rehab from 04/05/2020 in [place]  Lymphedema Life Impact Scale Total Score 63.24 %            [facility] Adult PT Treatment/Exercise - 05/08/20 0001      Manual Therapy   Edema Management considered 12" short stretch chest bandage but port is in place which made it not tolerable    Manual Lymphatic Drainage (MLD) In Supine: Short neck, superficial and deep abdominals, Lt inguinal nodes, Lt axillo-inguinal anastomosis, Lt chest and then Lt UE from proximal to distal, and then same to Rt side focusing on lateral trunk swelling.      Passive ROM to bilateral shoulders to tolerance                       PT Long Term Goals - 04/05/20 1108      PT LONG TERM GOAL #1   Title Pt will be ind with self MLD or pump use for lymphedema care    Time 6    Period Weeks    Status New  PT LONG TERM GOAL #3   Title Pt will decrease Lt UE volume by at least 255m    Baseline 32927m   Time 6    Period Weeks    Status New      PT LONG TERM GOAL #4   Title Pt will obtain compression garments for bil UEs and chest wall edema    Time 6    Period Weeks    Status New      PT LONG TERM GOAL #5   Title Pt will with visible reduction of Lt chest wall edema by at least 50% per photographic evidence    Time 6    Period Weeks    Status New                 Plan - 05/08/20 1051    Clinical Impression Statement Pt with increased edema Left axillary region / Lt lateral trunk compared to last visit also now without compresison to the area.  Asked pt if she would like to have a garment back to wear until the new ones come but was not interested.  Pt was also advised to decrease intenisty of stretches to avoid pectoralis spasm on the Rt.    PT Frequency  2x / week    PT Duration 6 weeks    PT Treatment/Interventions ADLs/Self Care Home Management;Therapeutic exercise;Patient/family education;Manual lymph drainage;Manual techniques;Compression bandaging;Taping;Passive range of motion;Scar mobilization    PT Next Visit Plan cont  MLD bil UE and chest Lt>Rt issuing handout for this, try supine scapular series next and issue to HEP if pt tolerates well;    Consulted and Agree with Plan of Care Patient           Patient will benefit from skilled therapeutic intervention in order to improve the following deficits and impairments:     Visit Diagnosis: Post-mastectomy lymphedema syndrome  Pain in axilla, unspecified laterality  Aftercare following surgery for neoplasm     Problem List There are no problems to display for this patient.   TeStark Bray/03/2020, 10:53 AM  [place]one: [phone] Fax:  [phone]: StDunya MeinersRN: [id number] of Birth: 11July 19, 1972

## 2020-05-10 ENCOUNTER — Ambulatory Visit: Payer: Medicare Other

## 2020-05-10 ENCOUNTER — Other Ambulatory Visit: Payer: Self-pay

## 2020-05-10 DIAGNOSIS — I972 Postmastectomy lymphedema syndrome: Secondary | ICD-10-CM | POA: Diagnosis not present

## 2020-05-10 DIAGNOSIS — Z483 Aftercare following surgery for neoplasm: Secondary | ICD-10-CM

## 2020-05-10 DIAGNOSIS — M79629 Pain in unspecified upper arm: Secondary | ICD-10-CM

## 2020-05-10 NOTE — Patient Instructions (Signed)

## 2020-05-10 NOTE — Therapy (Signed)
[place], [place], [address] Phone: [phone]   Fax:  [phone]  Physical Therapy Treatment  Patient Details  Name: Maria Monroe MRN: [id number] Date of Birth: September 30, 1970 Referring Provider (PT): Maryla Morrow, NP   Encounter Date: 05/10/2020   PT End of Session - 05/10/20 1004    Visit Number 7    Number of Visits 13    Date for PT Re-Evaluation 06/05/20    Authorization Type medicare/medicaid    PT Start Time 0906    PT Stop Time 1003    PT Time Calculation (min) 57 min    Activity Tolerance Patient tolerated treatment well    Behavior During Therapy [facility] for tasks assessed/performed           Past Medical History:  Diagnosis Date  . Bone metastasis ([place])   . Cancer [facility])    overlapping sites of right breast  . DVT (deep venous thrombosis) ([place])   . H/O bilateral mastectomy   . HER2-positive carcinoma of right breast ([place])   . Lymphedema of both arms     History reviewed. No pertinent surgical history.  There were no vitals filed for this visit.   Subjective Assessment - 05/10/20 0908    Subjective I will say the pain is gone, I'm just really sore across my chest. I stopped doing the cane exercises bc they were making my underarms (axilla) hurt.    Pertinent History History of inflammatory triple positive Rt breast cancer with neoadjuvant chemotherapy, Rt mastectomy with ALND on 04/01/2012 with 2/12 nodes positive. followed by radiation and additional chemotherapy due to chest wall invasion, dermal lymphatic invasion.  Total hysterectomy in 2014.  Lt mastectomy in 2015. Spinal bone metastases present with palliative radiation completed to C7 and T2 10 doses.  Recent PET showing mediastinal lymph node involvement.  Maintenance therapy of Herceptin and Perjeta every 3 weeks.  Sarcoidosis, mild restrictive lung disease    Patient Stated Goals decrease the swelling    Currently in  Pain? No/denies                       Outpatient [facility]05/[facility]  Lymphedema Life Impact Scale Total Score 63.24 %            [facility] Adult PT Treatment/Exercise - 05/10/20 0001      Shoulder Exercises: Supine   Horizontal ABduction Strengthening;Both;5 reps;Theraband    Theraband Level (Shoulder Horizontal ABduction) Level 1 (Yellow)    Horizontal ABduction Limitations Pt returned therapist demo for each of following exs    External Rotation Strengthening;Both;5 reps;Theraband    Theraband Level (Shoulder External Rotation) Level 1 (Yellow)    External Rotation Limitations Work within pain tolerance    Flexion Strengthening;Both;5 reps;Theraband   Narrow and Wide Grip, 5x each   Theraband Level (Shoulder Flexion) Level 1 (Yellow)    Diagonals Strengthening;Right;Left;5 reps;Theraband    Theraband Level (Shoulder Diagonals) Level 1 (Yellow)    Diagonals Limitations Demo and tactile cues for correct UE position of stretch      Shoulder Exercises: Pulleys   Flexion 2 minutes    Flexion Limitations Returning therapist demo and cuing to relax shoulders througout, pt struggled with this. End motion was much more limited than P/ROM    Scaption Limitations Started trying scaption but pt reported Rt pectoralis muscle spasm and wanted to stop      Manual Therapy   Manual  Therapy Manual Lymphatic Drainage (MLD);Passive ROM;Taping    Manual Lymphatic Drainage (MLD) In Supine: Short neck, superficial and deep abdominals, Lt inguinal nodes, Lt axillo-inguinal anastomosis, Lt chest and then Lt UE from proximal to distal, and then same to Rt side focusing on lateral trunk swelling.      Passive ROM to bilateral shoulders to tolerance into flexion and abduction, full     Kinesiotex Edema      Kinesiotix   Edema On each side along axillo-inguinal anastomosis with 2 fingers running towards ant and post trunk coming to skin folds                    PT Education - 05/10/20 0922    Education Details Supine scapular series with yellow theraband    Person(s) Educated Patient    Methods Explanation;Demonstration;Handout    Comprehension Verbalized understanding;Returned demonstration;Need further instruction               PT Long Term Goals - 04/05/20 1108      PT LONG TERM GOAL #1   Title Pt will be ind with self MLD or pump use for lymphedema care    Time 6    Period Weeks    Status New      PT LONG TERM GOAL #3   Title Pt will decrease Lt UE volume by at least 265m    Baseline 32979m   Time 6    Period Weeks    Status New      PT LONG TERM GOAL #4   Title Pt will obtain compression garments for bil UEs and chest wall edema    Time 6    Period Weeks    Status New      PT LONG TERM GOAL #5   Title Pt will with visible reduction of Lt chest wall edema by at least 50% per photographic evidence    Time 6    Period Weeks    Status New                 Plan - 05/10/20 1347    Clinical Impression Statement Progressed HEP to include supine scapular series but instructed to do these with light reps and every other day, do not push into pain. She reported some muscle spasm after scap series but this quickly decreased with arm at rest. She verbalized understanding. Trial of kiensiotape along bil axillo-inguinal anastomosis to see if this help decongest axillary regions at all. Continued with manual therapy as done before and pt with full P/ROM by end of session in bil shoulders. Added pulleys today but she struggled with technique, could not relax and would not stretch into end motion that was attained with P/ROM during session (pulleys done at end of session today).    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, sarcoidosis, possible DVT    Examination-Activity Limitations Lift     Examination-Participation Restrictions Cleaning;Laundry;Shop    Stability/Clinical Decision Making Evolving/Moderate complexity    Rehab Potential Good    PT Frequency 2x / week    PT Duration 6 weeks    PT Treatment/Interventions ADLs/Self Care Home Management;Therapeutic exercise;Patient/family education;Manual lymph drainage;Manual techniques;Compression bandaging;Taping;Passive range of motion;Scar mobilization    PT Next Visit Plan cont  MLD bil UE and chest Lt>Rt issuing handout for this, review supine scapular series next and how is it going? How did she tolerate kinesiotape?  Cont if helpful.    PT Home Exercise Plan Try wearing her compression shirt with komprex foam    Consulted and Agree with Plan of Care Patient           Patient will benefit from skilled therapeutic intervention in order to improve the following deficits and impairments:  Increased fascial restricitons, Pain, Increased edema, Impaired UE functional use, Postural dysfunction, Decreased endurance  Visit Diagnosis: Post-mastectomy lymphedema syndrome  Pain in axilla, unspecified laterality  Aftercare following surgery for neoplasm     Problem List There are no problems to display for this patient.   Otelia Limes, PTA 05/10/2020, 1:52 PM  [place], [place], [address] Phone: [phone]   Fax:  [phone]  Name: Maria Monroe MRN: [id number] Date of Birth: 03/22/1971

## 2020-05-15 ENCOUNTER — Ambulatory Visit: Payer: Medicare Other | Admitting: Rehabilitation

## 2020-05-15 ENCOUNTER — Other Ambulatory Visit: Payer: Self-pay

## 2020-05-15 ENCOUNTER — Encounter: Payer: Self-pay | Admitting: Rehabilitation

## 2020-05-15 DIAGNOSIS — M79629 Pain in unspecified upper arm: Secondary | ICD-10-CM

## 2020-05-15 DIAGNOSIS — I972 Postmastectomy lymphedema syndrome: Secondary | ICD-10-CM

## 2020-05-15 DIAGNOSIS — Z483 Aftercare following surgery for neoplasm: Secondary | ICD-10-CM

## 2020-05-15 NOTE — Therapy (Signed)
[place], [place], [address] Phone: [phone]   Fax:  [phone]  Physical Therapy Treatment  Patient Details  Name: Jerri Glauser MRN: [id number] Date of Birth: March 27, 1971 Referring Provider (PT): Maryla Morrow, NP   Encounter Date: 05/15/2020   PT End of Session - 05/15/20 1058    Visit Number 8    Number of Visits 13    Date for PT Re-Evaluation 06/05/20    PT Start Time 7867    PT Stop Time 1055    PT Time Calculation (min) 53 min    Activity Tolerance Patient tolerated treatment well    Behavior During Therapy [facility] for tasks assessed/performed           Past Medical History:  Diagnosis Date  . Bone metastasis ([place])   . Cancer [facility])    overlapping sites of right breast  . DVT (deep venous thrombosis) ([place])   . H/O bilateral mastectomy   . HER2-positive carcinoma of right breast ([place])   . Lymphedema of both arms     History reviewed. No pertinent surgical history.  There were no vitals filed for this visit.   Subjective Assessment - 05/15/20 1001    Subjective I woke up having muscles spasm in the back and upper chest.  I have had to take some extra pain medication. I am just mentally and physically tired my new BP medication is giving me hives.    Pertinent History History of inflammatory triple positive Rt breast cancer with neoadjuvant chemotherapy, Rt mastectomy with ALND on 04/01/2012 with 2/12 nodes positive. followed by radiation and additional chemotherapy due to chest wall invasion, dermal lymphatic invasion.  Total hysterectomy in 2014.  Lt mastectomy in 2015. Spinal bone metastases present with palliative radiation completed to C7 and T2 10 doses.  Recent PET showing mediastinal lymph node involvement.  Maintenance therapy of Herceptin and Perjeta every 3 weeks.  Sarcoidosis, mild restrictive lung disease    Patient Stated Goals decrease the swelling    Currently in  Pain? Yes    Pain Score 8     Pain Location Back   chest   Pain Orientation Right;Left    Pain Descriptors / Indicators Aching;Burning    Pain Type Chronic pain    Pain Onset More than a month ago    Pain Frequency Constant                       [facility]patient [facility]  Lymphedema Life Impact Scale Total Score 63.24 %            [facility] Adult PT Treatment/Exercise - 05/15/20 0001      Manual Therapy   Manual Therapy Soft tissue mobilization    Soft tissue mobilization in sidelying to bilateral back due to spasm, latissimus, paraspinals, rhomboids, UT with tightness globally also in supine to bilateral chest pectoralis.  Very gentle pressure overall due to increased spasm.     Manual Lymphatic Drainage (MLD) In Supine: Lt inguinal nodes, Lt axillo-inguinal anastomosis, Lt chest and then Lt UE from proximal to distal, and then same to Rt side focusing on lateral trunk swelling.      Passive ROM attempted bil but increased muscle spasm                       PT Long Term Goals - 04/05/20 1108  PT LONG TERM GOAL #1   Title Pt will be ind with self MLD or pump use for lymphedema care    Time 6    Period Weeks    Status New      PT LONG TERM GOAL #3   Title Pt will decrease Lt UE volume by at least 236m    Baseline 32954m   Time 6    Period Weeks    Status New      PT LONG TERM GOAL #4   Title Pt will obtain compression garments for bil UEs and chest wall edema    Time 6    Period Weeks    Status New      PT LONG TERM GOAL #5   Title Pt will with visible reduction of Lt chest wall edema by at least 50% per photographic evidence    Time 6    Period Weeks    Status New                 Plan - 05/15/20 1058    Clinical Impression Statement Pt arrvies more groggy, tired, in higher levels of pain and spasm and feeling overall very frustrated.  Focused on pain relief/STM today  with pt asleep most of the session and spasms evident in bil pectoralis even asleep.  Pt has not tried muscle relaxers in a long time and was made aware that these may be beneficial.    PT Frequency 2x / week    PT Duration 6 weeks    PT Treatment/Interventions ADLs/Self Care Home Management;Therapeutic exercise;Patient/family education;Manual lymph drainage;Manual techniques;Compression bandaging;Taping;Passive range of motion;Scar mobilization    PT Next Visit Plan how is pain and spasm and adjust accordingly,  cont  MLD bil UE and chest Lt>Rt issuing handout for this, review supine scapular series next and how is it going? How did she tolerate kinesiotape? Cont if helpful.    Consulted and Agree with Plan of Care Patient           Patient will benefit from skilled therapeutic intervention in order to improve the following deficits and impairments:     Visit Diagnosis: Post-mastectomy lymphedema syndrome  Pain in axilla, unspecified laterality  Aftercare following surgery for neoplasm     Problem List There are no problems to display for this patient.   TeStark Bray/14/2021, 11:00 AM  [id number]: [phone] Fax:  [phone]: StDarlisha KelmRN: [id number] of Birth: [birth year]

## 2020-05-17 ENCOUNTER — Ambulatory Visit: Payer: Medicare Other

## 2020-05-17 ENCOUNTER — Other Ambulatory Visit: Payer: Self-pay

## 2020-05-17 DIAGNOSIS — I972 Postmastectomy lymphedema syndrome: Secondary | ICD-10-CM

## 2020-05-17 DIAGNOSIS — Z483 Aftercare following surgery for neoplasm: Secondary | ICD-10-CM

## 2020-05-17 DIAGNOSIS — M79629 Pain in unspecified upper arm: Secondary | ICD-10-CM

## 2020-05-17 NOTE — Therapy (Signed)
[place], [place], [address] Phone: [phone]   Fax:  [phone]  Physical Therapy Treatment  Patient Details  Name: Maria Monroe MRN: [id number] Date of Birth: 02-11-1971 Referring Provider (PT): Maryla Morrow, NP   Encounter Date: 05/17/2020   PT End of Session - 05/17/20 1100    Visit Number 9    Number of Visits 13    Date for PT Re-Evaluation 06/05/20    Authorization Type medicare/medicaid    PT Start Time 1008    PT Stop Time 1050    PT Time Calculation (min) 42 min    Activity Tolerance Treatment limited secondary to agitation    Behavior During Therapy [facility] for tasks assessed/performed           Past Medical History:  Diagnosis Date  . Bone metastasis ([place])   . Cancer [facility])    overlapping sites of right breast  . DVT (deep venous thrombosis) ([place])   . H/O bilateral mastectomy   . HER2-positive carcinoma of right breast ([place])   . Lymphedema of both arms     History reviewed. No pertinent surgical history.  There were no vitals filed for this visit.   Subjective Assessment - 05/17/20 1008    Subjective I had a doctor appt yesterday and they did an MRI with contrast but I had a bad reaction to the contrast and was vomitting. I don't know any results yet. I think I just need to be on hold for a few weeks until I am feeling better.    Pertinent History History of inflammatory triple positive Rt breast cancer with neoadjuvant chemotherapy, Rt mastectomy with ALND on 04/01/2012 with 2/12 nodes positive. followed by radiation and additional chemotherapy due to chest wall invasion, dermal lymphatic invasion.  Total hysterectomy in 2014.  Lt mastectomy in 2015. Spinal bone metastases present with palliative radiation completed to C7 and T2 10 doses.  Recent PET showing mediastinal lymph node involvement.  Maintenance therapy of Herceptin and Perjeta every 3 weeks.  Sarcoidosis, mild  restrictive lung disease    Patient Stated Goals decrease the swelling    Currently in Pain? Yes    Pain Score 7     Pain Location Chest    Pain Orientation Right;Left    Pain Descriptors / Indicators Pressure   tingling in hands   Pain Type Chronic pain    Pain Radiating Towards bil axillae    Pain Onset More than a month ago    Pain Frequency Constant    Aggravating Factors  being still for awhile    Pain Relieving Factors pain meds, incorrectly fitting compression                 LYMPHEDEMA/ONCOLOGY QUESTIONNAIRE - 05/17/20 0001      Left Upper Extremity Lymphedema   15 cm Proximal to Olecranon Process 38.1 cm    10 cm Proximal to Olecranon Process 37.1 cm    Olecranon Process 30 cm    15 cm Proximal to Ulnar Styloid Process 29.7 cm    10 cm Proximal to Ulnar Styloid Process 26 cm    Just Proximal to Ulnar Styloid Process 18.6 cm    Across Hand at [employer] 19.6 cm    At [place] of 2nd Digit 6.4 cm    Other 7.1                Outpatient Rehab from 04/05/2020 in [place]  Lymphedema Life Impact Scale Total Score 63.24 %              [facility] Adult PT Treatment/Exercise - 05/17/20 0001      Manual Therapy   Soft tissue mobilization In Supine with coconut oil and slow, prolonged holds with STM to bil pectoralis but longer to Rt side as Lt spasmed around port, so focused more on MLD on Lt side    Manual Lymphatic Drainage (MLD) In Supine: Lt inguinal nodes, Lt axillo-inguinal anastomosis, Lt chest and then Lt UE from proximal to distal    Passive ROM Was able to briefly perform Rt UE flexion but did not do longer as pt was beginning to have pectoralis spasm                       PT Long Term Goals - 05/17/20 1011      PT LONG TERM GOAL #1   Title Pt will be ind with self MLD or pump use for lymphedema care    Baseline Pt is independent with MLD and is awaiting insurance approval for compression pump -  05/17/20    Status Achieved      PT LONG TERM GOAL #3   Title Pt will decrease Lt UE volume by at least 210m      PT LONG TERM GOAL #4   Title Pt will obtain compression garments for bil UEs and chest wall edema    Baseline Pt has been measured for these and is pending insurance approval-05/17/20    Status Partially Met      PT LONG TERM GOAL #5   Title Pt will with visible reduction of Lt chest wall edema by at least 50% per photographic evidence    Baseline No change with this as of yet but pt has yet to receive new compression garments - 05/17/20    Status On-going                 Plan - 05/17/20 1101    Clinical Impression Statement Ended session early today due to pts request. She started having Lt back spasm then hot flash and was irritable after this and requested ending session. She would like to D/C at this time to rest her body. She had an MRI to check her heart function, per pt, and wants to focus on heart issues for now, rest her body, and then try to resume therapy in a few months. She is interested in pursuing aquatic therapy as she repotrs having good success with this in the past. Informed her of new [facility] location opening end of the year and she repots will inform her MD of this to look into referral. Pt will be D/C at this time per her request. She did make progress towards goals, see LTGs.    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    PT Home Exercise Plan Try wearing her compression shirt with komprex foam; supine scapular series as  able    Consulted and Agree with Plan of Care Patient           Patient will benefit from skilled therapeutic intervention in order to improve the following deficits and impairments:  Increased fascial restricitons, Pain, Increased edema, Impaired UE functional use, Postural dysfunction, Decreased endurance  Visit Diagnosis: Post-mastectomy lymphedema syndrome  Pain in axilla, unspecified laterality  Aftercare following surgery for neoplasm     Problem List There are no problems to display for this patient.   Otelia Limes, PTA 05/17/2020, 11:08 AM  [place], [place], [address] Phone: [phone]   Fax:  [phone]  Name: Danamarie Minami MRN: [id number] Date of Birth: 1970/10/19

## 2020-06-07 ENCOUNTER — Emergency Department (HOSPITAL_COMMUNITY)
Admission: EM | Admit: 2020-06-07 | Discharge: 2020-06-08 | Disposition: A | Discharge status: Home / Self Care | Payer: Medicare Other | Attending: Emergency Medicine | Admitting: Emergency Medicine

## 2020-06-07 ENCOUNTER — Encounter (HOSPITAL_COMMUNITY): Payer: Self-pay | Admitting: Emergency Medicine

## 2020-06-07 ENCOUNTER — Other Ambulatory Visit: Payer: Self-pay

## 2020-06-07 DIAGNOSIS — R55 Syncope and collapse: Secondary | ICD-10-CM

## 2020-06-07 DIAGNOSIS — Z853 Personal history of malignant neoplasm of breast: Secondary | ICD-10-CM | POA: Insufficient documentation

## 2020-06-07 DIAGNOSIS — R112 Nausea with vomiting, unspecified: Secondary | ICD-10-CM | POA: Diagnosis not present

## 2020-06-07 DIAGNOSIS — E860 Dehydration: Secondary | ICD-10-CM | POA: Insufficient documentation

## 2020-06-07 DIAGNOSIS — Z87891 Personal history of nicotine dependence: Secondary | ICD-10-CM | POA: Diagnosis not present

## 2020-06-07 MED ORDER — ONDANSETRON 4 MG PO TBDP
4.0000 mg | ORAL_TABLET | Freq: Once | ORAL | Status: AC | PRN
  Administered 2020-06-07: 4 mg via ORAL
  Filled 2020-06-07: qty 1

## 2020-06-07 NOTE — ED Triage Notes (Signed)
Pt to ED c/o nausea and vomiting since yesterday.  Pt st's vomiting started after eating bacon.  Pt denies diarrhea or abd pain.  Pt currently vomiting in triage

## 2020-06-08 ENCOUNTER — Emergency Department (HOSPITAL_COMMUNITY): Payer: Medicare Other

## 2020-06-08 DIAGNOSIS — R55 Syncope and collapse: Secondary | ICD-10-CM | POA: Diagnosis not present

## 2020-06-08 LAB — COMPREHENSIVE METABOLIC PANEL
ALT: 15 U/L (ref 0–44)
AST: 20 U/L (ref 15–41)
Albumin: 3.9 g/dL (ref 3.5–5.0)
Alkaline Phosphatase: 57 U/L (ref 38–126)
Anion gap: 10 (ref 5–15)
BUN: 14 mg/dL (ref 6–20)
CO2: 26 mmol/L (ref 22–32)
Calcium: 10 mg/dL (ref 8.9–10.3)
Chloride: 104 mmol/L (ref 98–111)
Creatinine, Ser: 1.06 mg/dL — ABNORMAL HIGH (ref 0.44–1.00)
GFR calc non Af Amer: >60 mL/min (ref >60)
Glucose, Bld: 132 mg/dL — ABNORMAL HIGH (ref 70–99)
Potassium: 4.3 mmol/L (ref 3.5–5.1)
Sodium: 140 mmol/L (ref 135–145)
Total Bilirubin: 0.4 mg/dL (ref 0.3–1.2)
Total Protein: 8 g/dL (ref 6.5–8.1)

## 2020-06-08 LAB — CBC
HCT: 41.4 % (ref 36.0–46.0)
Hemoglobin: 13.1 g/dL (ref 12.0–15.0)
MCH: 28.4 pg (ref 26.0–34.0)
MCHC: 31.6 g/dL (ref 30.0–36.0)
MCV: 89.8 fL (ref 80.0–100.0)
Platelets: 460 10*3/uL — ABNORMAL HIGH (ref 150–400)
RBC: 4.61 MIL/uL (ref 3.87–5.11)
RDW: 12.1 % (ref 11.5–15.5)
WBC: 7.6 10*3/uL (ref 4.0–10.5)
nRBC: 0 % (ref 0.0–0.2)

## 2020-06-08 LAB — URINALYSIS, ROUTINE W REFLEX MICROSCOPIC
Bilirubin Urine: NEGATIVE
Glucose, UA: NEGATIVE mg/dL
Hgb urine dipstick: NEGATIVE
Ketones, ur: NEGATIVE mg/dL
Nitrite: NEGATIVE
Protein, ur: 30 mg/dL — AB
Specific Gravity, Urine: 1.031 — ABNORMAL HIGH (ref 1.005–1.030)
pH: 5 (ref 5.0–8.0)

## 2020-06-08 LAB — LIPASE, BLOOD: Lipase: 36 U/L (ref 11–51)

## 2020-06-08 IMAGING — DX DG CHEST 1V PORT
1 series · 1 of 1 positions shown · non-contrast
Comparison: [DATE]

CLINICAL DATA: Syncope

EXAM:
PORTABLE CHEST 1 VIEW

[chest ap]
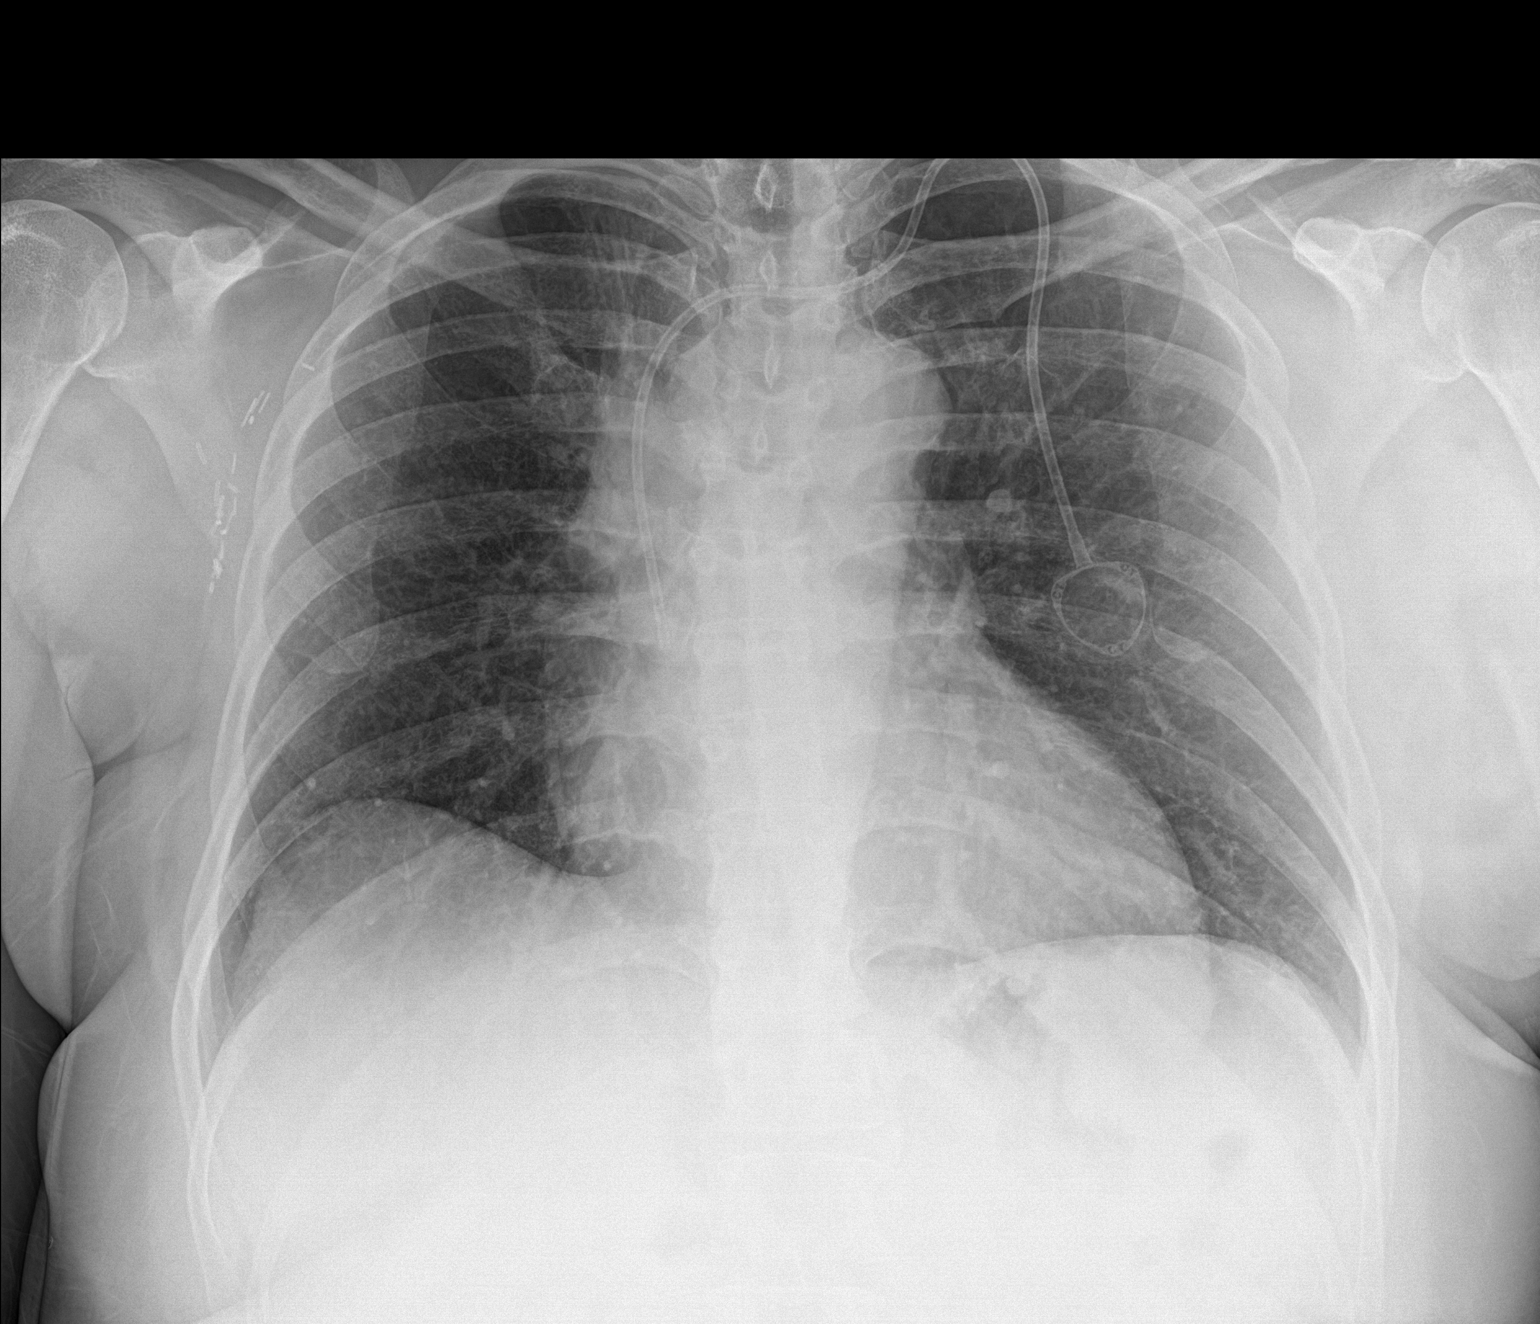

[1 of 1 positions shown; findings below may reference images not displayed]

FINDINGS: The heart size and mediastinal contours are within normal limits. A
left-sided MediPort catheter seen with the tip at the superior
cavoatrial junction. Both lungs are clear. The visualized skeletal
structures are unremarkable. Surgical clips seen within the right
axilla.
IMPRESSION: No active disease.

## 2020-06-08 IMAGING — CT CT CERVICAL SPINE W/O CM
3 of 4 series · 11 of 33 positions shown, 13 images · non-contrast
Comparison: None.

CLINICAL DATA: Nausea and vomiting since yesterday after eating
bacon.

EXAM:
CT HEAD WITHOUT CONTRAST
CT CERVICAL SPINE WITHOUT CONTRAST
TECHNIQUE: Multidetector CT imaging of the head and cervical spine was
performed following the standard protocol without intravenous
contrast. Multiplanar CT image reconstructions of the cervical spine
were also generated.

[Series 4: c_spine 2.0 st · axial · 0.36mm/px · z∈[-294,-194]mm · 3 of 76 slices shown, 4 images]
[im 13/76  soft-tissue]
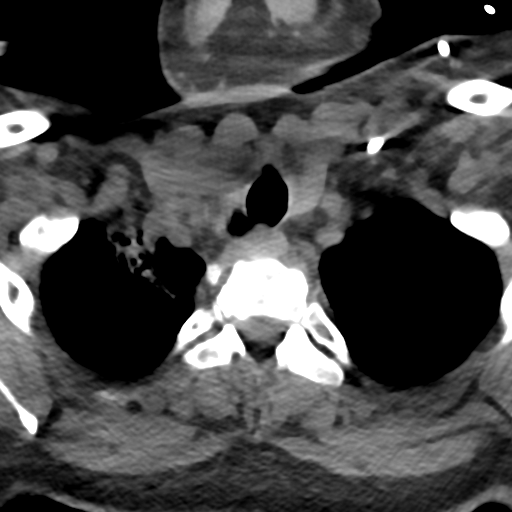
[im 13/76  bone]
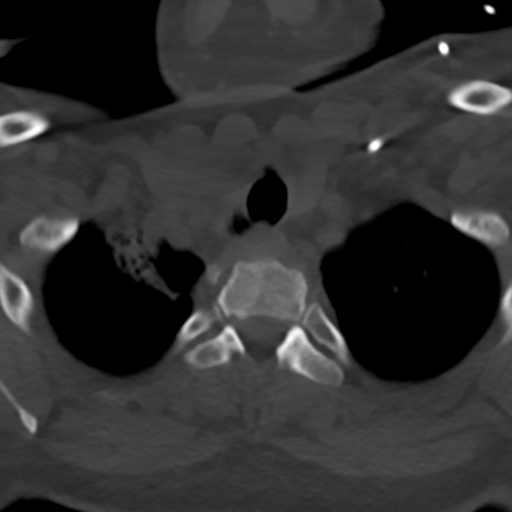
[im 38/76  bone]
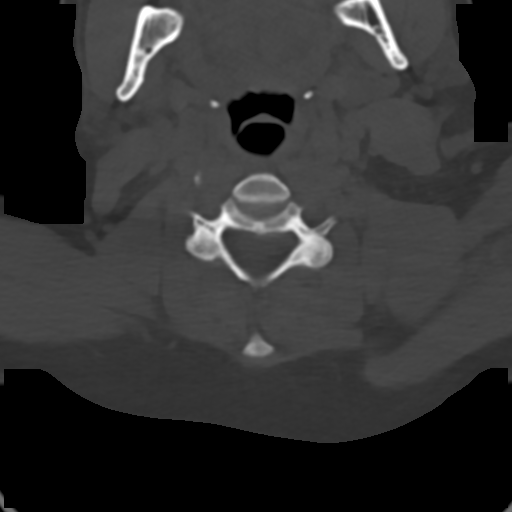
[im 63/76  bone]
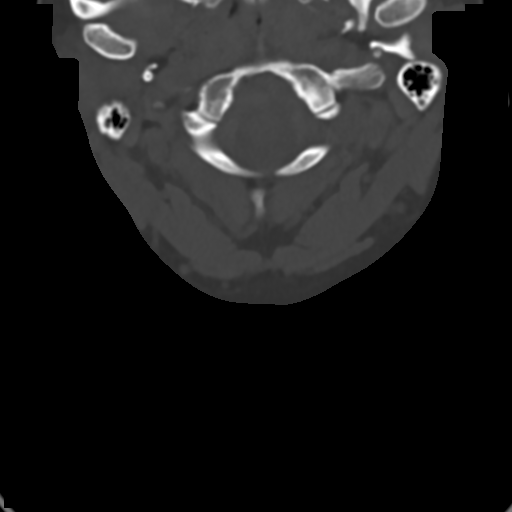

[Series 8: c_spine 2.0 sag bone · sagittal · 0.23mm/px · 5 of 61 slices shown, 6 images]
[im 21/61  bone]
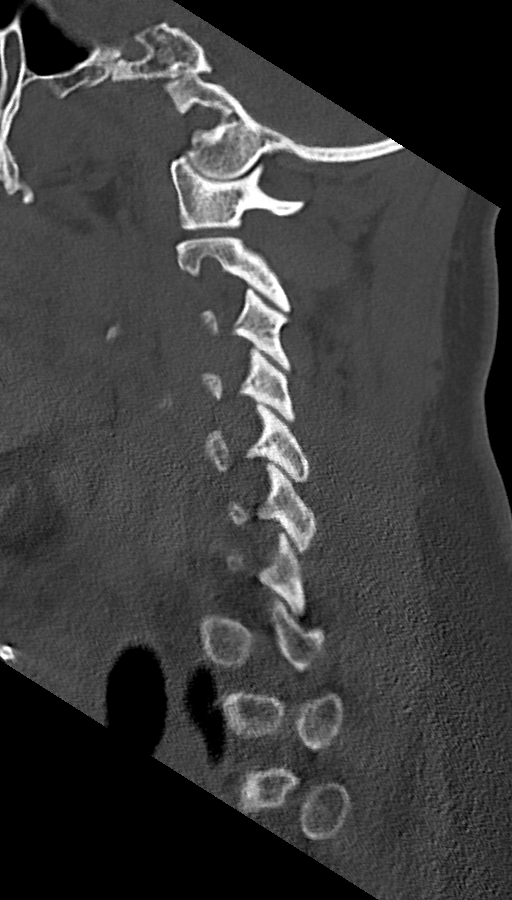
[im 26/61  bone]
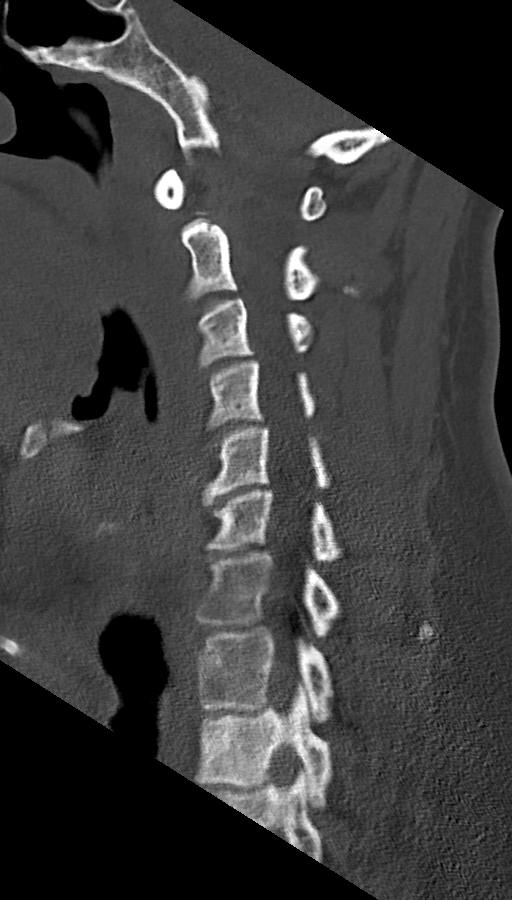
[im 31/61  soft-tissue]
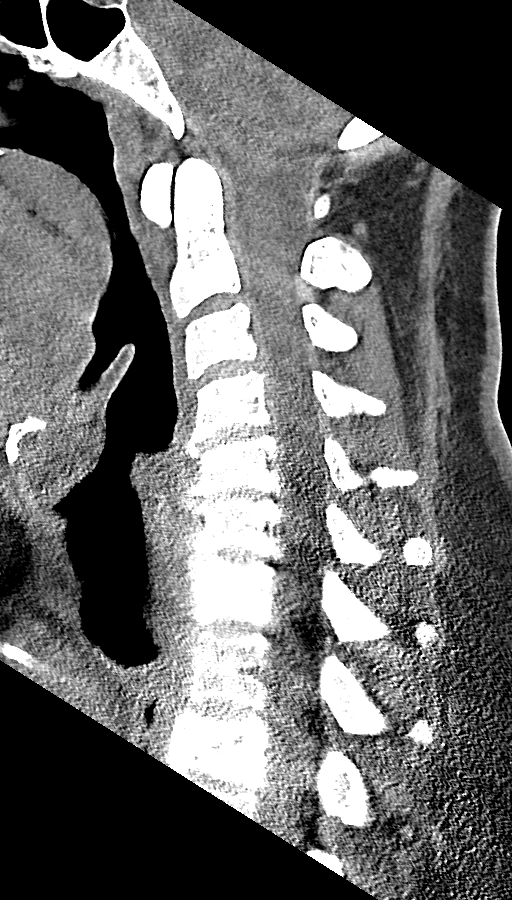
[im 31/61  bone]
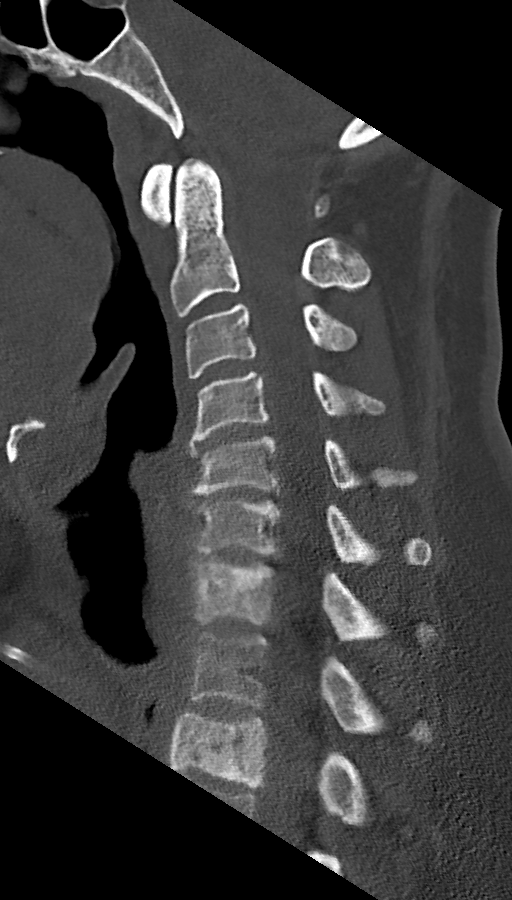
[im 36/61  bone]
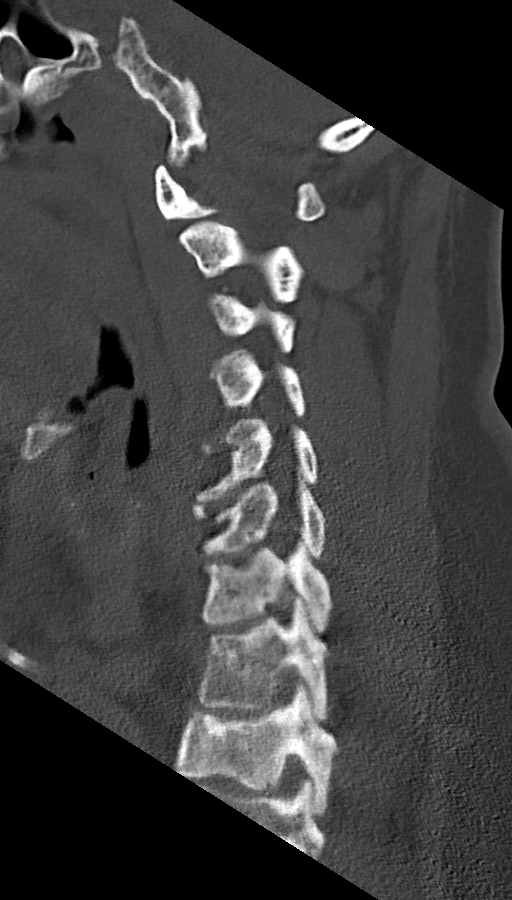
[im 41/61  bone]
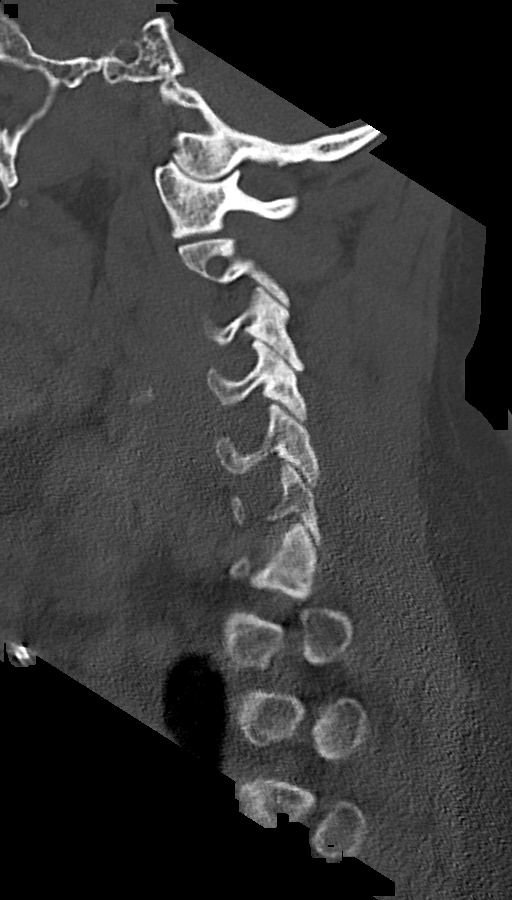

[Series 9: c_spine 2.0 cor bone · coronal · 0.23mm/px · 3 of 61 slices shown]
[im 17/61  bone]
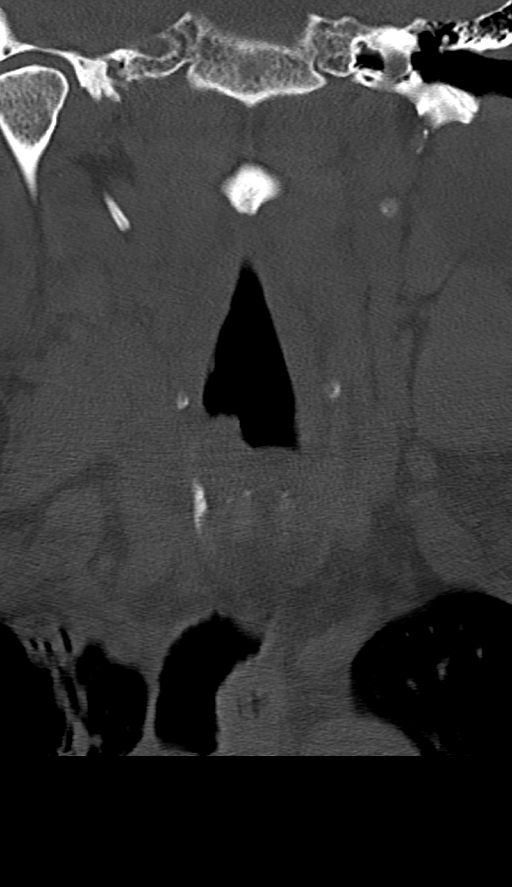
[im 26/61  bone]
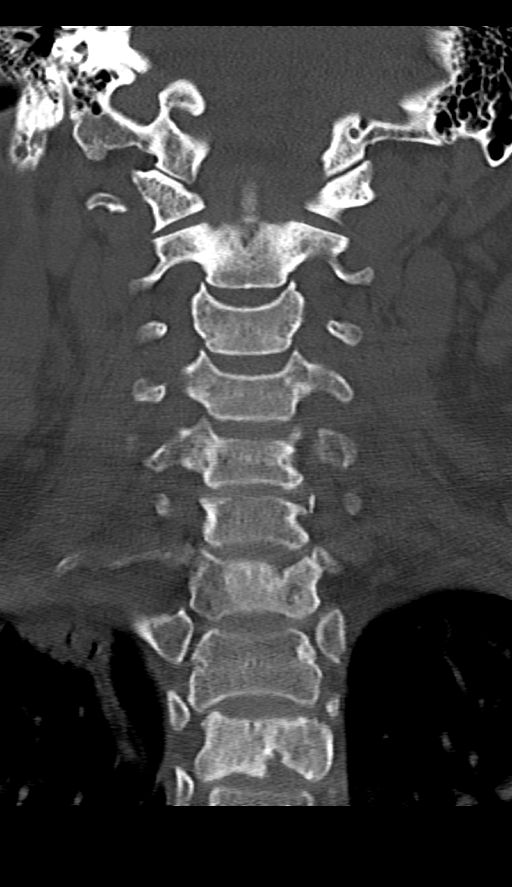
[im 35/61  bone]
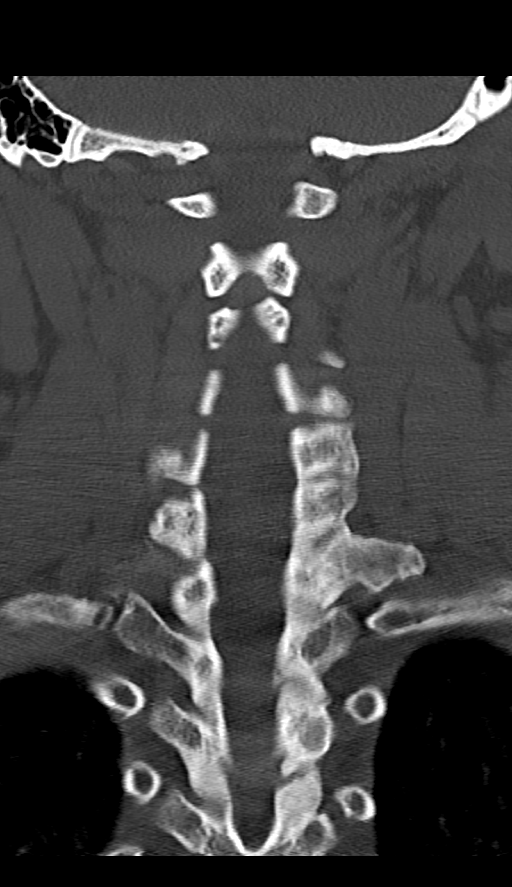

[11 of 33 positions shown; findings below may reference images not displayed]

FINDINGS: CT HEAD FINDINGS

Brain: No evidence of acute infarction, hemorrhage, hydrocephalus,
extra-axial collection or mass lesion/mass effect.

Vascular: No hyperdense vessel or unexpected calcification.

Skull: Normal. Negative for fracture or focal lesion.

Sinuses/Orbits: Unremarkable

CT CERVICAL SPINE FINDINGS

Alignment: Unremarkable accounting for positioning

Skull base and vertebrae: No acute fracture or bone erosion.
Sclerotic lesions in the C7 and T2 vertebrae, primarily affecting
the bodies, correlating with chart history of bone cancer.

Soft tissues and spinal canal: No prevertebral fluid or swelling. No
visible canal hematoma.

Findings of vocal cord paresis on the left. No visible mass along
the pathway of the recurrent laryngeal nerve.

Disc levels: Overall mild degenerative changes without visible cord
impingement

Upper chest: Radiation fibrosis appearance at the right upper lobe.
IMPRESSION: 1. No acute intracranial or cervical spine finding.
2. Osseous metastatic disease, known per the chart.
3. Findings of left vocal cord paresis.

## 2020-06-08 IMAGING — CT CT HEAD W/O CM
4 series · 16 of 47 positions shown, 18 images · non-contrast
Comparison: None.

CLINICAL DATA: Nausea and vomiting since yesterday after eating
bacon.

EXAM:
CT HEAD WITHOUT CONTRAST
CT CERVICAL SPINE WITHOUT CONTRAST
TECHNIQUE: Multidetector CT imaging of the head and cervical spine was
performed following the standard protocol without intravenous
contrast. Multiplanar CT image reconstructions of the cervical spine
were also generated.

[Series 1: head without · axial · non-contrast · 0.43mm/px · z∈[-190,-70]mm · 7 of 33 slices shown, 9 images]
[im 5/33  brain]
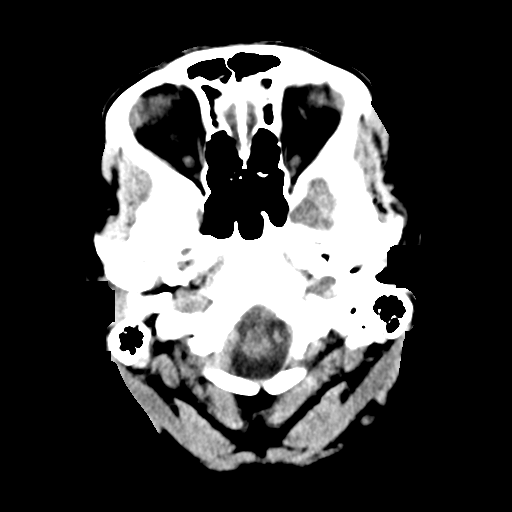
[im 5/33  bone]
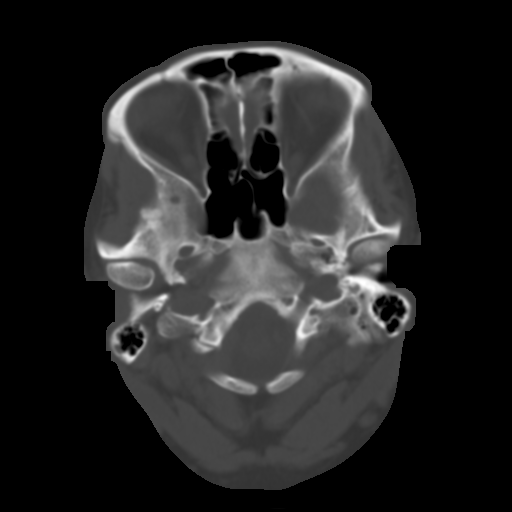
[im 9/33  brain]
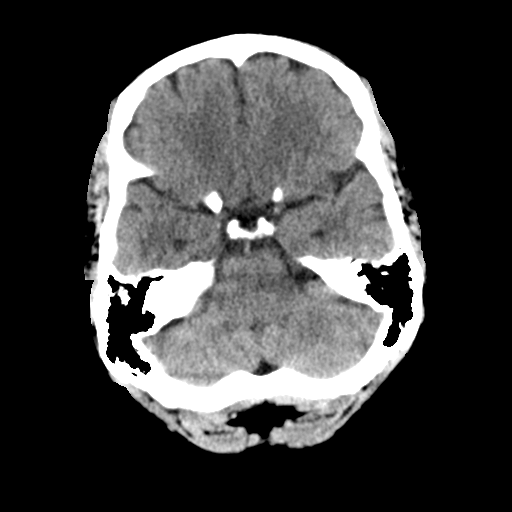
[im 13/33  brain]
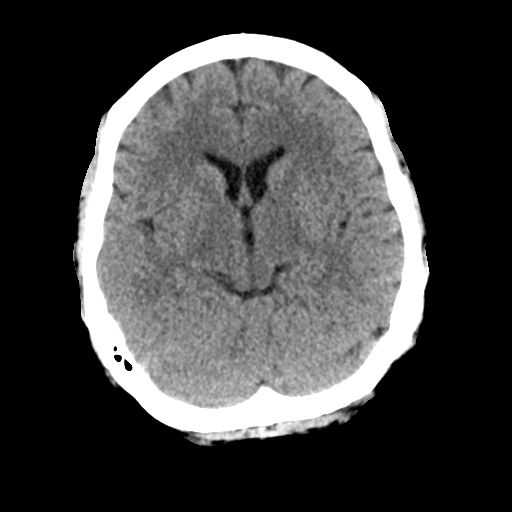
[im 17/33  brain]
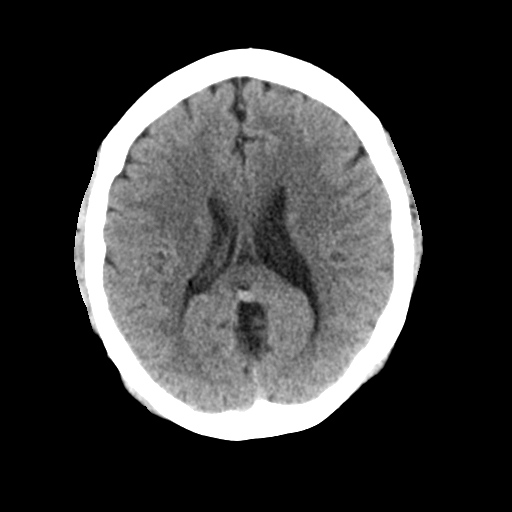
[im 21/33  brain]
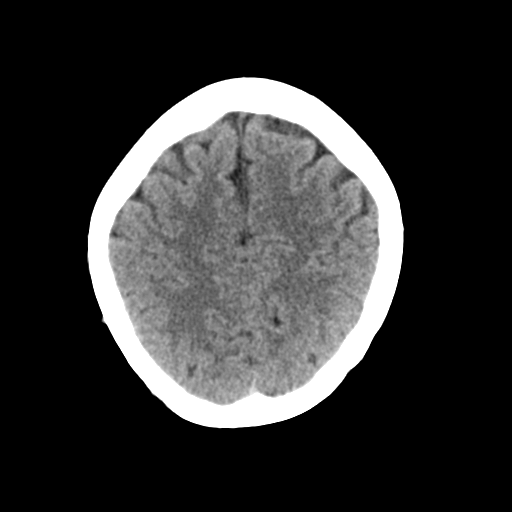
[im 21/33  bone]
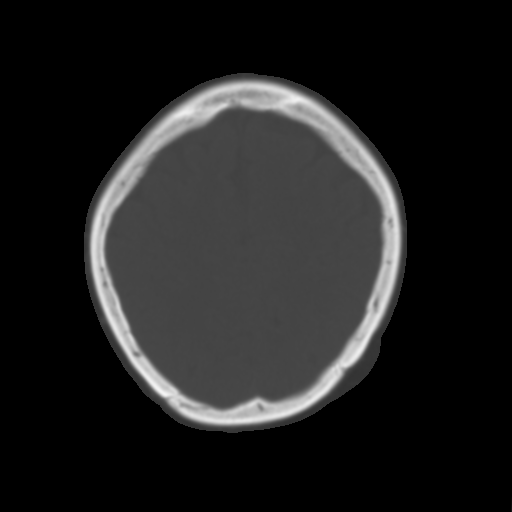
[im 25/33  brain]
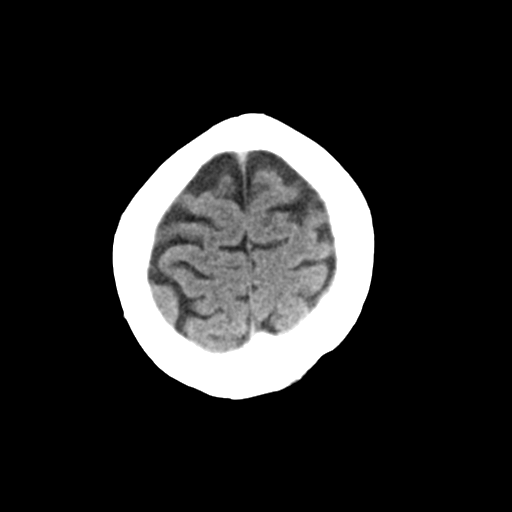
[im 29/33  brain]
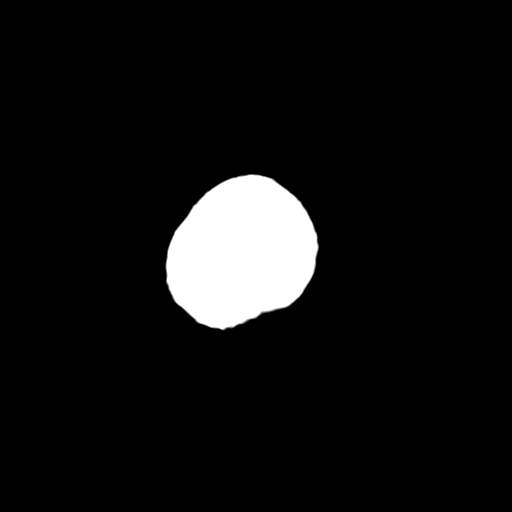

[Series 4: head bone · axial · 0.43mm/px · z∈[-194,-162]mm · 3 of 81 slices shown]
[im 9/81  bone]
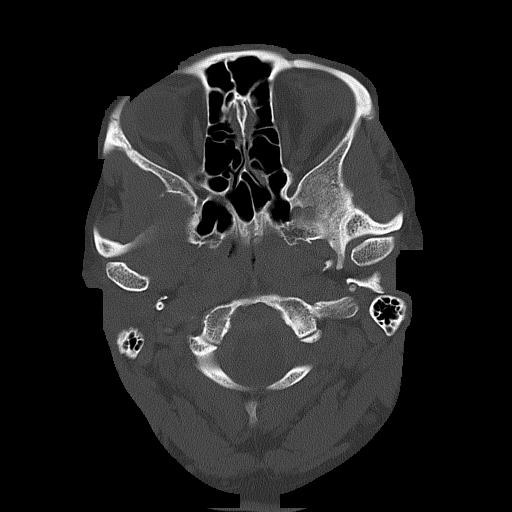
[im 17/81  bone]
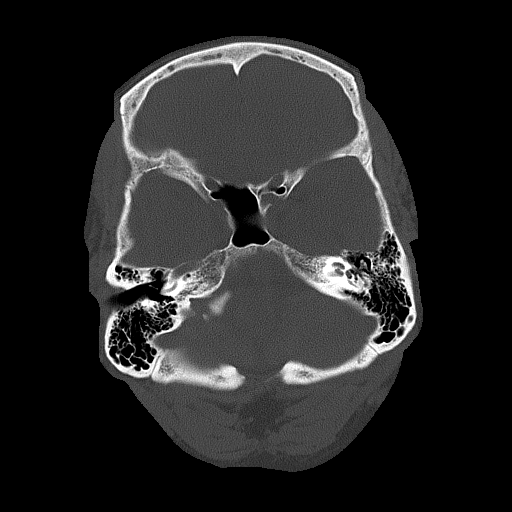
[im 25/81  bone]
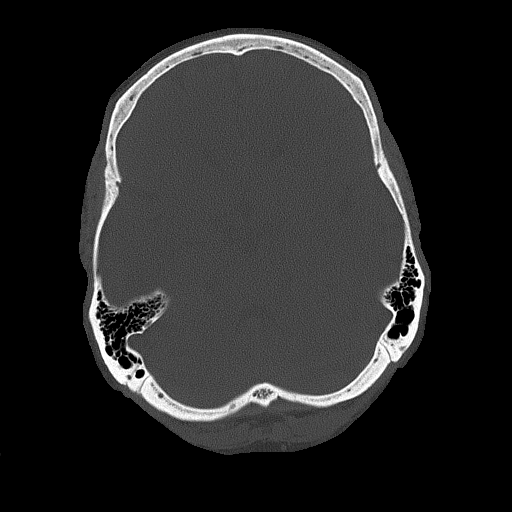

[Series 5: head without cor · coronal · non-contrast · 0.29mm/px · 3 of 65 slices shown]
[im 22/65  brain]
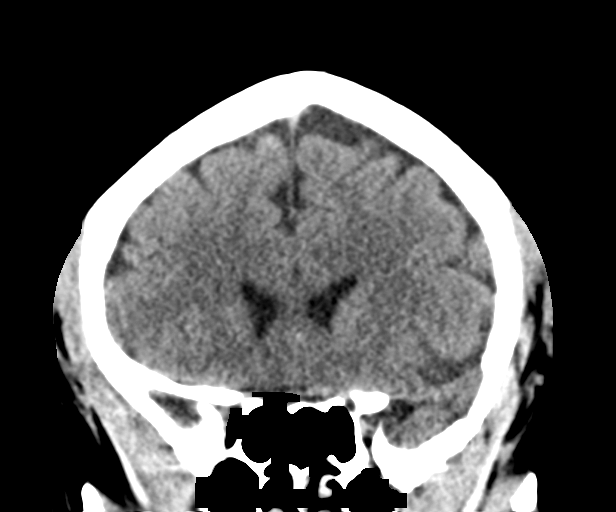
[im 29/65  brain]
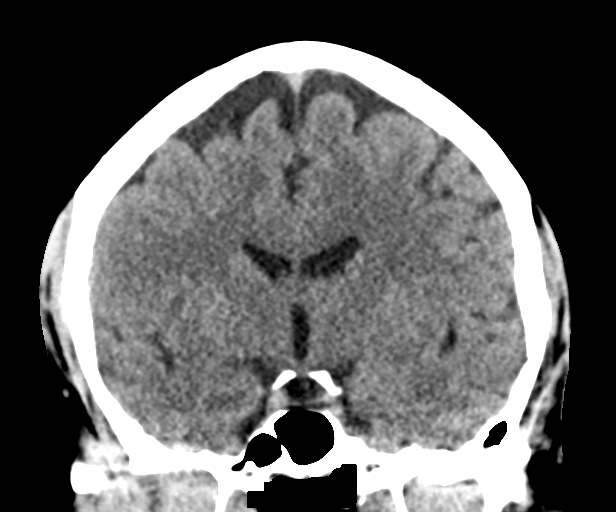
[im 36/65  brain]
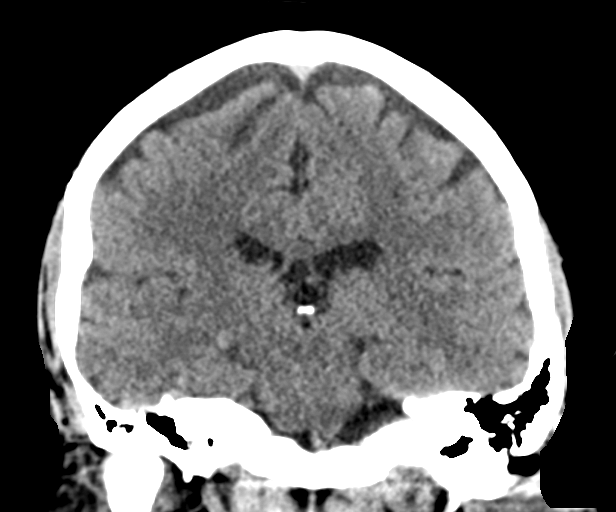

[Series 6: head without sag · sagittal · non-contrast · 0.30mm/px · 3 of 56 slices shown]
[im 19/56  brain]
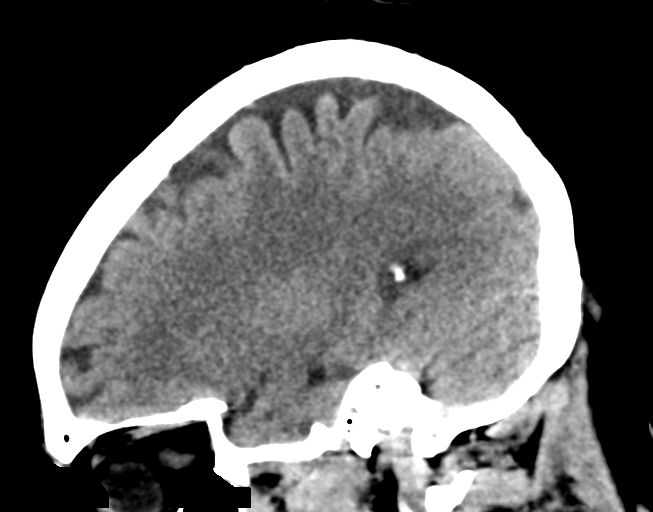
[im 28/56  brain]
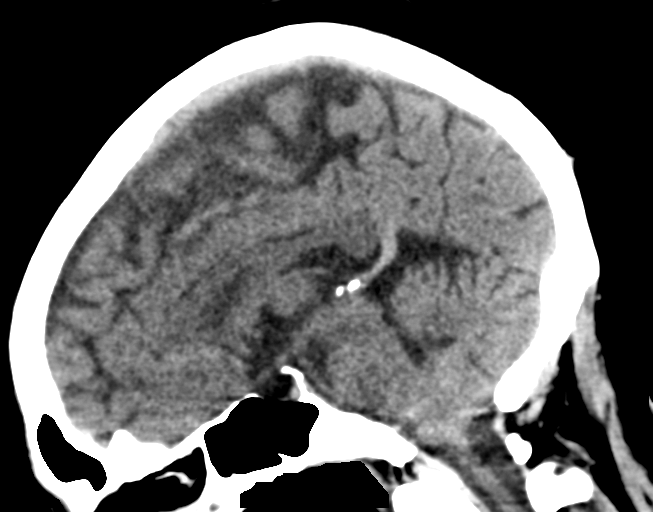
[im 37/56  brain]
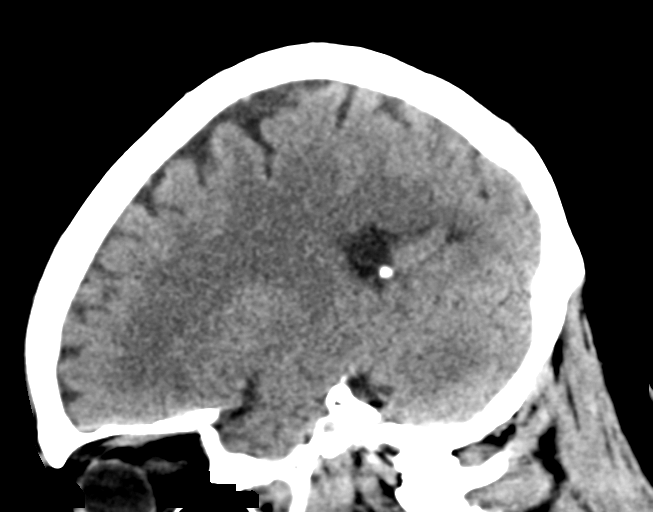

[16 of 47 positions shown; findings below may reference images not displayed]

FINDINGS: CT HEAD FINDINGS

Brain: No evidence of acute infarction, hemorrhage, hydrocephalus,
extra-axial collection or mass lesion/mass effect.

Vascular: No hyperdense vessel or unexpected calcification.

Skull: Normal. Negative for fracture or focal lesion.

Sinuses/Orbits: Unremarkable

CT CERVICAL SPINE FINDINGS

Alignment: Unremarkable accounting for positioning

Skull base and vertebrae: No acute fracture or bone erosion.
Sclerotic lesions in the C7 and T2 vertebrae, primarily affecting
the bodies, correlating with chart history of bone cancer.

Soft tissues and spinal canal: No prevertebral fluid or swelling. No
visible canal hematoma.

Findings of vocal cord paresis on the left. No visible mass along
the pathway of the recurrent laryngeal nerve.

Disc levels: Overall mild degenerative changes without visible cord
impingement

Upper chest: Radiation fibrosis appearance at the right upper lobe.
IMPRESSION: 1. No acute intracranial or cervical spine finding.
2. Osseous metastatic disease, known per the chart.
3. Findings of left vocal cord paresis.

## 2020-06-08 MED ORDER — SODIUM CHLORIDE 0.9% FLUSH: 10.0000 mL | INTRAVENOUS | Status: DC | PRN

## 2020-06-08 MED ORDER — CHLORHEXIDINE GLUCONATE CLOTH 2 % EX PADS: 6.0000 | MEDICATED_PAD | Freq: Every day | CUTANEOUS | Status: DC

## 2020-06-08 MED ORDER — SODIUM CHLORIDE 0.9 % IV BOLUS
1000.0000 mL | Freq: Once | INTRAVENOUS | Status: AC
  Administered 2020-06-08: 1000 mL via INTRAVENOUS

## 2020-06-08 MED ORDER — SODIUM CHLORIDE 0.9% FLUSH: 10.0000 mL | Freq: Two times a day (BID) | INTRAVENOUS | Status: DC

## 2020-06-08 MED ORDER — ONDANSETRON HCL 4 MG PO TABS: 4.0000 mg | ORAL_TABLET | Freq: Three times a day (TID) | ORAL | 0 refills | Status: DC | PRN

## 2020-06-08 NOTE — ED Provider Notes (Signed)
Emergency Department Provider Note   I have reviewed the triage vital signs and the nursing notes.   HISTORY  Chief Complaint Emesis   HPI Maria Monroe is a 49 y.o. female with PMH of breast cancer with bony mets followed at [facility], Sarcoidosis, and lymphedema presents to the ED with nausea and vomiting with syncope today. Patient developed symptoms yesterday after eating eggs and bacon. She has had 2-3 episodes of non-bloody emesis without abdominal pain. Today, she was in the car with a family member when she felt sudden, severe nausea. The car was pulled to the side of the road and the patient awoke laying on the ground beside the car. She apparently had a witnessed, brief, syncope episode. She did note some bleeding to the bridge of the nose and is having some right sided throbbing HA. She has intermittent tingling in the right hand which is chronic and not worse. No new weakness/numbness. No fever. No CP or SOB. No witness seizure activity. Patient given Zofran on arrival to the ED and nausea is improved. She is currently undergoing maintenance chemotherapy at [facility] with last cycle in late September.    Past Medical History:  Diagnosis Date  . Bone metastasis ([place])   . Cancer [facility])    overlapping sites of right breast  . DVT (deep venous thrombosis) ([place])   . H/O bilateral mastectomy   . HER2-positive carcinoma of right breast ([place])   . Lymphedema of both arms     There are no problems to display for this patient.   History reviewed. No pertinent surgical history.  Allergies Aspirin, Tomato, Citric acid, and Nabumetone  No family history on file.  Social History Social History   Tobacco Use  . Smoking status: Former Research scientist (life sciences)  . Smokeless tobacco: Never Used  Substance Use Topics  . Alcohol use: Never  . Drug use: Never    Review of Systems  Constitutional: No fever/chills Eyes: No visual changes. ENT: No sore throat. Cardiovascular: Denies chest pain.  Positive syncope.  Respiratory: Denies shortness of breath. Gastrointestinal: No abdominal pain. Positive nausea and vomiting.  No diarrhea.  No constipation. Genitourinary: Negative for dysuria. Musculoskeletal: Negative for back pain. Skin: Negative for rash. Neurological: Negative for focal weakness or numbness. Positive HA. Tingling in the right arm/hand.   10-point ROS otherwise negative.  ____________________________________________   PHYSICAL EXAM:  VITAL SIGNS: ED Triage Vitals  Enc Vitals Group     BP 06/07/20 2327 (!) 162/110     Pulse Rate 06/07/20 2327 81     Resp 06/07/20 2327 16     Temp 06/07/20 2327 98 F (36.7 C)     Temp Source 06/07/20 2327 Oral     SpO2 06/07/20 2327 99 %     Weight 06/07/20 2330 210 lb (95.3 kg)     Height 06/07/20 2330 5' 6"  (1.676 m)   Constitutional: Alert and oriented. Well appearing and in no acute distress. Eyes: Conjunctivae are normal. PERRL. Head: Atraumatic. Nose: No congestion/rhinnorhea. Mouth/Throat: Mucous membranes are slightly dry.  Neck: No stridor.  Cardiovascular: Normal rate, regular rhythm. Good peripheral circulation. Grossly normal heart sounds.   Respiratory: Normal respiratory effort.  No retractions. Lungs CTAB. Gastrointestinal: Soft and nontender. No focal tenderness, rebound, or guarding. No distention.  Musculoskeletal: No lower extremity tenderness nor edema. No gross deformities of extremities. Neurologic:  Normal speech and language. No gross focal neurologic deficits are appreciated. 5/5 strength in the upper/lower extremities. No numbness.  Skin:  Skin is warm, dry and intact. No rash noted.   ____________________________________________   LABS (all labs ordered are listed, but only abnormal results are displayed)  Labs Reviewed  COMPREHENSIVE METABOLIC PANEL - Abnormal; Notable for the following components:      Result Value   Glucose, Bld 132 (*)    Creatinine, Ser 1.06 (*)    All other  components within normal limits  CBC - Abnormal; Notable for the following components:   Platelets 460 (*)    All other components within normal limits  URINALYSIS, ROUTINE W REFLEX MICROSCOPIC - Abnormal; Notable for the following components:   APPearance HAZY (*)    Specific Gravity, Urine 1.031 (*)    Protein, ur 30 (*)    Leukocytes,Ua SMALL (*)    Bacteria, UA RARE (*)    All other components within normal limits  LIPASE, BLOOD   ____________________________________________  EKG   EKG Interpretation  Date/Time:  Friday June 08 2020 03:12:59 EDT Ventricular Rate:  79 PR Interval:    QRS Duration: 81 QT Interval:  361 QTC Calculation: 417 R Axis:   7 Text Interpretation: Sinus rhythm Probable left atrial enlargement Left ventricular hypertrophy Similar to prior tracing. No STEMI Confirmed by Nanda Quinton [phone]) on 06/08/2020 3:19:40 AM       ____________________________________________  RADIOLOGY  CT Head Wo Contrast  Result Date: 06/08/2020 CLINICAL DATA:  Nausea and vomiting since yesterday after eating bacon. EXAM: CT HEAD WITHOUT CONTRAST CT CERVICAL SPINE WITHOUT CONTRAST TECHNIQUE: Multidetector CT imaging of the head and cervical spine was performed following the standard protocol without intravenous contrast. Multiplanar CT image reconstructions of the cervical spine were also generated. COMPARISON:  None. FINDINGS: CT HEAD FINDINGS Brain: No evidence of acute infarction, hemorrhage, hydrocephalus, extra-axial collection or mass lesion/mass effect. Vascular: No hyperdense vessel or unexpected calcification. Skull: Normal. Negative for fracture or focal lesion. Sinuses/Orbits: Unremarkable CT CERVICAL SPINE FINDINGS Alignment: Unremarkable accounting for positioning Skull base and vertebrae: No acute fracture or bone erosion. Sclerotic lesions in the C7 and T2 vertebrae, primarily affecting the bodies, correlating with chart history of bone cancer. Soft tissues and  spinal canal: No prevertebral fluid or swelling. No visible canal hematoma. 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 No visible mass along the pathway of the recurrent laryngeal nerve. Disc levels: Overall mild degenerative changes without visible cord impingement Upper chest: Radiation fibrosis appearance at the right upper lobe. IMPRESSION: 1. No acute intracranial or cervical spine finding. 2. Osseous metastatic disease, known per the chart. 3. Findings of left vocal cord paresis. Electronically Signed   By: Monte Fantasia M.D.   On: 06/08/2020 04:28   CT Cervical Spine Wo Contrast  Result Date: 06/08/2020 CLINICAL DATA:  Nausea and vomiting since yesterday after eating bacon. EXAM: CT HEAD WITHOUT CONTRAST CT CERVICAL SPINE WITHOUT CONTRAST TECHNIQUE: Multidetector CT imaging of the head and cervical spine was performed following the standard protocol without intravenous contrast. Multiplanar CT image reconstructions of the cervical spine were also generated. COMPARISON:  None. FINDINGS: CT HEAD FINDINGS Brain: No evidence of acute infarction, hemorrhage, hydrocephalus, extra-axial collection or mass lesion/mass effect. Vascular: No hyperdense vessel or unexpected calcification. Skull: Normal. Negative for fracture or focal lesion. Sinuses/Orbits: Unremarkable CT CERVICAL SPINE FINDINGS Alignment: Unremarkable accounting for positioning Skull base and vertebrae: No acute fracture or bone erosion. Sclerotic lesions in the C7 and T2 vertebrae, primarily affecting the bodies, correlating with chart history of bone cancer. Soft tissues and spinal canal: No  prevertebral fluid or swelling. No visible canal hematoma. 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 No visible mass along the pathway of the recurrent laryngeal nerve. Disc levels: Overall mild degenerative changes without visible cord impingement Upper chest: Radiation fibrosis appearance at the right upper lobe. IMPRESSION: 1. No acute intracranial or  cervical spine finding. 2. Osseous metastatic disease, known per the chart. 3. Findings of left vocal cord paresis. Electronically Signed   By: Monte Fantasia M.D.   On: 06/08/2020 04:28   DG Chest Portable 1 View  Result Date: 06/08/2020 CLINICAL DATA:  Syncope EXAM: PORTABLE CHEST 1 VIEW COMPARISON:  Jan 10, 2020 FINDINGS: The heart size and mediastinal contours are within normal limits. A left-sided MediPort catheter seen with the tip at the superior cavoatrial junction. Both lungs are clear. The visualized skeletal structures are unremarkable. Surgical clips seen within the right axilla. IMPRESSION: No active disease. Electronically Signed   By: Prudencio Pair M.D.   On: 06/08/2020 03:21    ____________________________________________   PROCEDURES  Procedure(s) performed:   Procedures  None ____________________________________________   INITIAL IMPRESSION / ASSESSMENT AND PLAN / ED COURSE  Pertinent labs & imaging results that were available during my care of the patient were reviewed by me and considered in my medical decision making (see chart for details).   Patient arrives to the emergency department with nausea vomiting of the past 24 hours but also describing episode of syncope today while in the car.  She did sustain some mild abrasion to the nasal bridge but no laceration or hematoma to the scalp.  Patient does have history of metastatic breast cancer and I do plan for CT imaging of the head and cervical spine.  No focal neurologic deficits.  Patient did fall from a seated position to the ground after her syncope.  Plan for EKG, cardiac monitoring, IV fluids.  Abdomen is completely soft and nontender to diffuse palpation.  Lungs are clear.  Low suspicion clinically for PE or new intracranial process such as new mets.   Labs and imaging reviewed. Patient feeling improved with IVF and Zofran. Orthostatic vitals but no symptoms after IVF. Will continue supportive care at home and  f/u with PCP and oncology team.  ____________________________________________  FINAL CLINICAL IMPRESSION(S) / ED DIAGNOSES  Final diagnoses:  Syncope, unspecified syncope type  Non-intractable vomiting with nausea, unspecified vomiting type  Dehydration     MEDICATIONS GIVEN DURING THIS VISIT:  Medications  ondansetron (ZOFRAN-ODT) disintegrating tablet 4 mg (4 mg Oral Given 06/07/20 2336)  sodium chloride 0.9 % bolus 1,000 mL (0 mLs Intravenous Stopped 06/08/20 0511)     NEW OUTPATIENT MEDICATIONS STARTED DURING THIS VISIT:  Discharge Medication List as of 06/08/2020  6:37 AM    START taking these medications   Details  ondansetron (ZOFRAN) 4 MG tablet Take 1 tablet (4 mg total) by mouth every 8 (eight) hours as needed for nausea or vomiting., Starting Fri 06/08/2020, Normal        Note:  This document was prepared using Dragon voice recognition software and may include unintentional dictation errors.  Nanda Quinton, MD, [facility] Emergency Medicine    Emmely Bittinger, [facility], MD 06/08/20 [phone]

## 2020-06-08 NOTE — ED Notes (Signed)
Water and apple juice given for PO trial per patient request.

## 2020-06-08 NOTE — Discharge Instructions (Signed)
You were seen in the ED today with passing out, dehydration, and vomiting. I have called in some medication for vomiting to your pharmacy to take as directed. Please drink plenty of hydrating fluids today. When going to stand please do so slowly. Call your primary care doctor to discuss your ED visit and make your cancer team aware of your symptoms as well. Return to the ED with any new or worsening symptoms.

## 2020-06-09 ENCOUNTER — Emergency Department (HOSPITAL_COMMUNITY): Payer: Medicare Other

## 2020-06-09 ENCOUNTER — Other Ambulatory Visit: Payer: Self-pay

## 2020-06-09 ENCOUNTER — Emergency Department (HOSPITAL_COMMUNITY)
Admission: EM | Admit: 2020-06-09 | Discharge: 2020-06-09 | Disposition: A | Discharge status: Home / Self Care | Payer: Medicare Other | Attending: Emergency Medicine | Admitting: Emergency Medicine

## 2020-06-09 DIAGNOSIS — Z87891 Personal history of nicotine dependence: Secondary | ICD-10-CM | POA: Insufficient documentation

## 2020-06-09 DIAGNOSIS — Z853 Personal history of malignant neoplasm of breast: Secondary | ICD-10-CM | POA: Diagnosis not present

## 2020-06-09 DIAGNOSIS — R519 Headache, unspecified: Secondary | ICD-10-CM | POA: Diagnosis not present

## 2020-06-09 DIAGNOSIS — Z79899 Other long term (current) drug therapy: Secondary | ICD-10-CM | POA: Diagnosis not present

## 2020-06-09 IMAGING — MR MR HEAD WO/W CM
13 of 15 series · 40 of 48 positions shown · IV contrast (gadavist)
Comparison: CT head without contrast [DATE]

CLINICAL DATA: Breast cancer with osseous metastases. Syncopal
episode today. Vomiting. Right-sided headache. Epistaxis.

EXAM:
MRI HEAD WITHOUT AND WITH CONTRAST
TECHNIQUE: Multiplanar, multiecho pulse sequences of the brain and surrounding
structures were obtained without and with intravenous contrast.
CONTRAST:  9.5mL GADAVIST GADOBUTROL 1 MMOL/ML IV SOLN

[Series 5: DWI · axial · 3.0mm · 0.88mm/px · z∈[-113,+28]mm · 5 of 96 slices shown (1 of 4)]
[im 1/96]
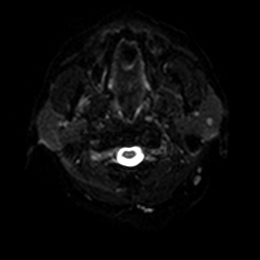
[im 24/96]
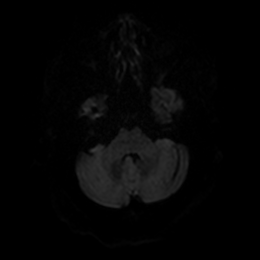
[im 48/96]
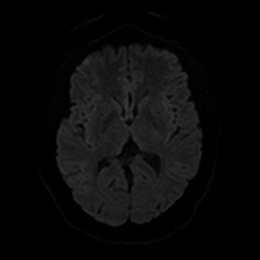
[im 72/96]
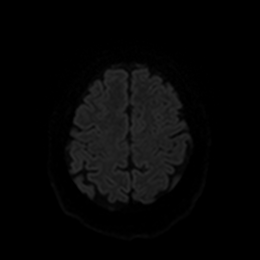
[im 96/96]
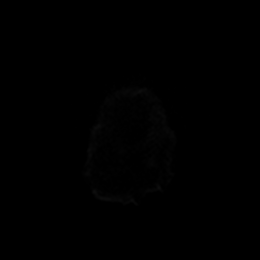

[Series 6: DWI · axial · 3.0mm · 0.88mm/px · z∈[-113,+28]mm · 3 of 48 slices shown (2 of 4)]
[im 1/48]
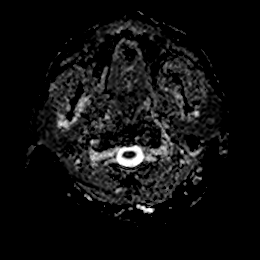
[im 24/48]
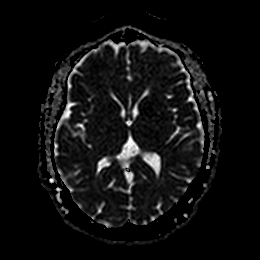
[im 48/48]
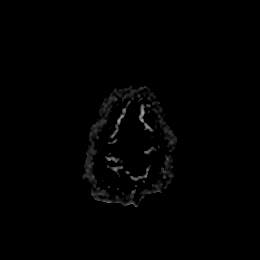

[Series 7: DWI · coronal · 4.0mm · 0.88mm/px · 4 of 66 slices shown (3 of 4)]
[im 1/66]
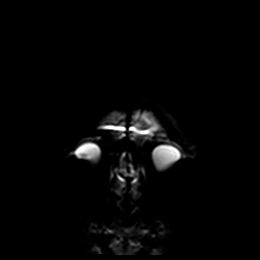
[im 22/66]
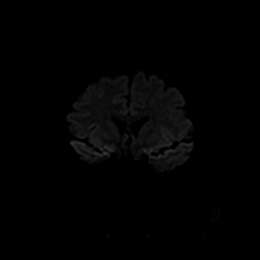
[im 44/66]
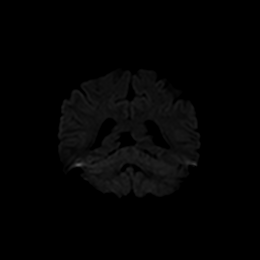
[im 66/66]
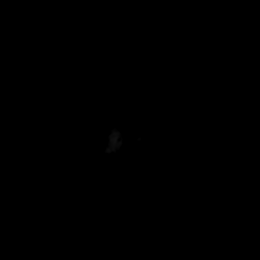

[Series 8: DWI · coronal · 4.0mm · 0.88mm/px · 2 of 33 slices shown (4 of 4)]
[im 1/33]
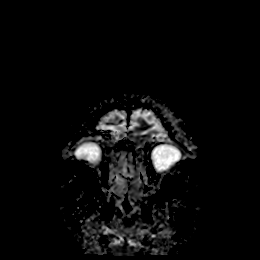
[im 33/33]
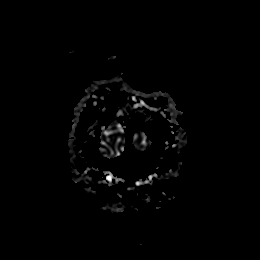

[Series 9: T1 · sagittal · 5.0mm · 0.75mm/px · 2 of 23 slices shown]
[im 1/23]
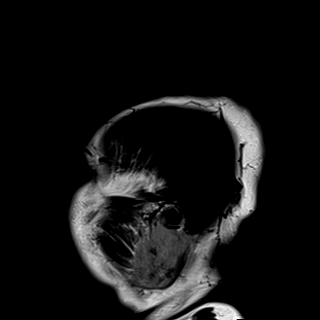
[im 23/23]
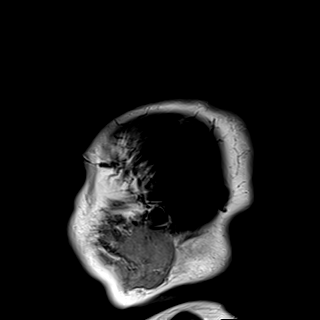

[Series 10: T2 · axial · 5.0mm · 0.72mm/px · z∈[-114,+29]mm · 2 of 25 slices shown]
[im 1/25]
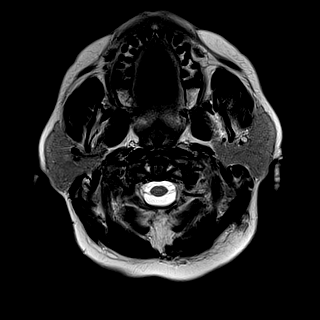
[im 25/25]
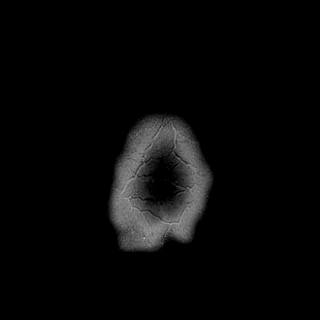

[Series 11: FLAIR · axial · 5.0mm · 0.45mm/px · z∈[-113,+31]mm · 2 of 25 slices shown]
[im 1/25]
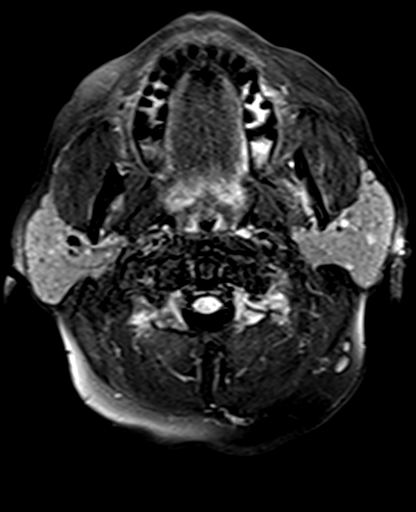
[im 25/25]
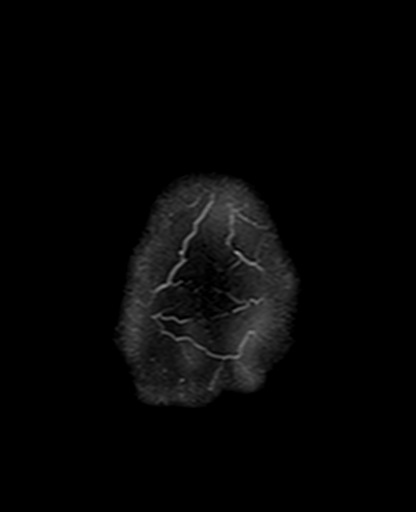

[Series 12: mag_images · axial · 3.0mm · 0.90mm/px · z∈[-129,+47]mm · 4 of 60 slices shown]
[im 1/60]
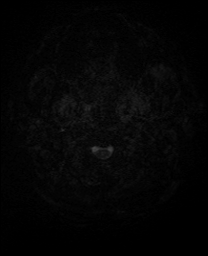
[im 20/60]
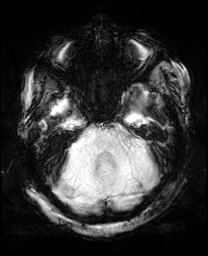
[im 40/60]
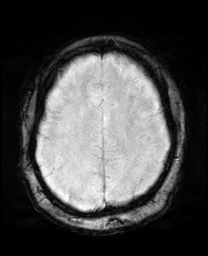
[im 60/60]
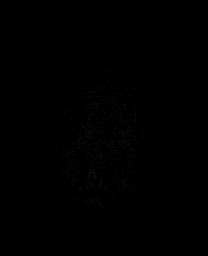

[Series 13: pha_images · axial · 3.0mm · 0.90mm/px · z∈[-129,+47]mm · 4 of 60 slices shown]
[im 1/60]
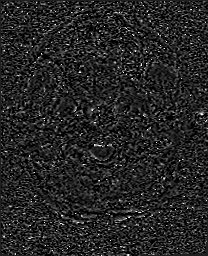
[im 20/60]
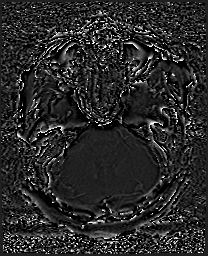
[im 40/60]
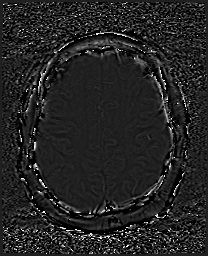
[im 60/60]
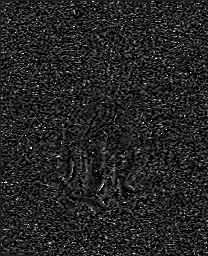

[Series 14: swi_images · axial · 3.0mm · 0.90mm/px · z∈[-129,+47]mm · 4 of 60 slices shown]
[im 1/60]
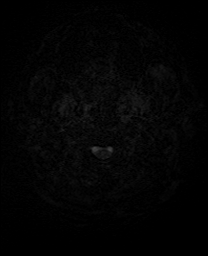
[im 20/60]
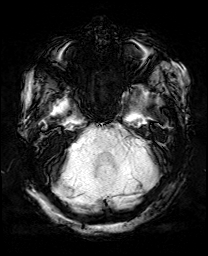
[im 40/60]
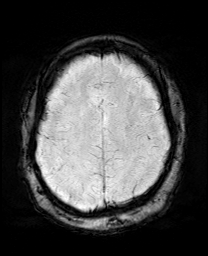
[im 60/60]
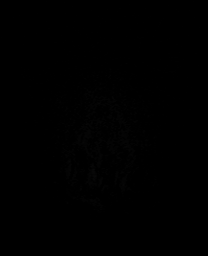

[Series 15: mip_images(sw) · axial · 24.0mm · 0.90mm/px · z∈[-119,+37]mm · 4 of 53 slices shown]
[im 1/53]
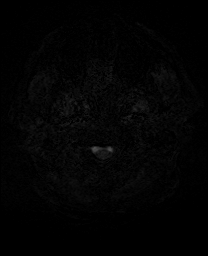
[im 18/53]
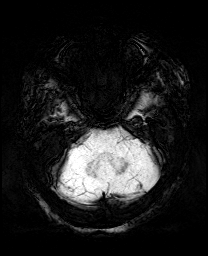
[im 35/53]
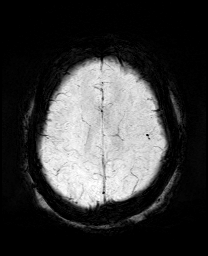
[im 53/53]
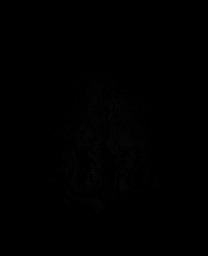

[Series 18: T1 post-contrast · coronal · 5.0mm · 0.34mm/px · 2 of 28 slices shown (1 of 2)]
[im 1/28]
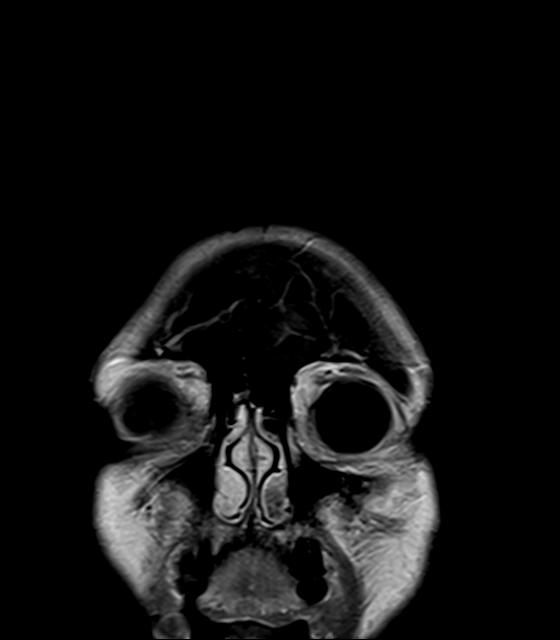
[im 28/28]
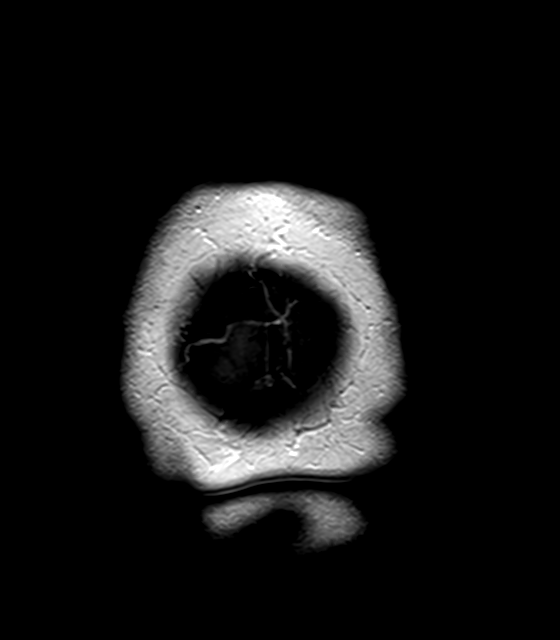

[Series 19: T1 post-contrast · sagittal · 5.0mm · 0.72mm/px · 2 of 23 slices shown (2 of 2)]
[im 1/23]
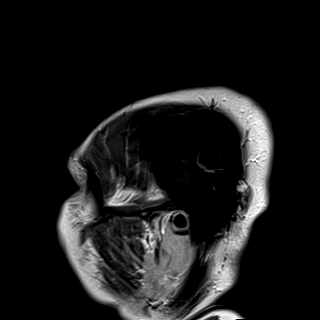
[im 23/23]
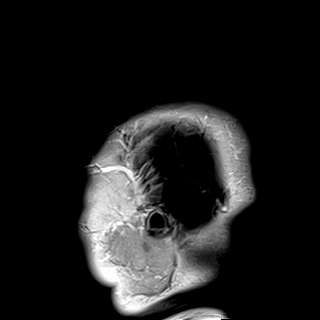

[40 of 48 positions shown; findings below may reference images not displayed]

FINDINGS: Brain: No acute infarct, hemorrhage, or mass lesion is present. The
ventricles are of normal size. No significant extraaxial fluid
collection is present. No significant white matter lesions are
present. Postcontrast images demonstrate no pathologic enhancement.
The brainstem and cerebellum are within normal limits. The internal
auditory canals are within normal limits.

Vascular: Flow is present in the major intracranial arteries.

Skull and upper cervical spine: The craniocervical junction is
normal. Upper cervical spine is within normal limits. Marrow signal
is unremarkable.

Sinuses/Orbits: Mild mucosal thickening is present in the inferior
maxillary sinuses bilaterally. The paranasal sinuses and mastoid air
cells are otherwise clear. The globes and orbits are within normal
limits.
IMPRESSION: 1. Normal MRI appearance of the brain. No evidence for metastatic
disease to the brain.
2. Minimal maxillary sinus disease.

## 2020-06-09 MED ORDER — GADOBUTROL 1 MMOL/ML IV SOLN
9.5000 mL | Freq: Once | INTRAVENOUS | Status: AC | PRN
  Administered 2020-06-09: 9.5 mL via INTRAVENOUS

## 2020-06-09 MED ORDER — ACETAMINOPHEN 500 MG PO TABS
1000.0000 mg | ORAL_TABLET | Freq: Once | ORAL | Status: AC
  Administered 2020-06-09: 1000 mg via ORAL
  Filled 2020-06-09: qty 2

## 2020-06-09 MED ORDER — PROCHLORPERAZINE EDISYLATE 10 MG/2ML IJ SOLN
10.0000 mg | Freq: Once | INTRAMUSCULAR | Status: AC
  Administered 2020-06-09: 10 mg via INTRAVENOUS
  Filled 2020-06-09: qty 2

## 2020-06-09 MED ORDER — DIPHENHYDRAMINE HCL 50 MG/ML IJ SOLN
12.5000 mg | Freq: Once | INTRAMUSCULAR | Status: AC
  Administered 2020-06-09: 12.5 mg via INTRAVENOUS
  Filled 2020-06-09: qty 1

## 2020-06-09 NOTE — ED Provider Notes (Signed)
[facility] Provider Note   CSN: [id number] Arrival date & time: 06/09/20  1221     History Chief Complaint  Patient presents with  . Headache    [facility] is a 49 y.o. female with past medical history of breast cancer with bony mets followed at [facility] undergoing maintenance chemotherapy, sarcoidosis, and lymphedema who presents for evaluation of a headache. She was seen in the ED yesterday for nausea, vomiting, syncope.  Symptoms improved after IV fluids and Zofran.  Negative work-up at that time included EKG, chest x-ray, CT head and neck, and basic labs. Since then, she has not had return of nausea, vomiting, syncope, but has a persistent right-sided headache with pressure behind her right eye. Denies dizziness, visual disturbance, neck pain. Because of her headache, she spoke with her oncologist at [facility], who wanted her to have an MRI with contrast to evaluate for periorbital mets.  Patient is also concerned today because the last time she had a syncopal episode, and that her cancer had to come back. In addition, she is concerned about her blood pressure, which has been elevated over the past several weeks. She has 150s/100s in the room, but states she is normally 120s over 90s. She is taking losartan and Coreg. Denies chest pain, shortness of breath, visual disturbance, abdominal pain, decreased urine output.   Headache Pain location:  R parietal Quality:  Sharp Radiates to:  Eyes Onset quality:  Gradual Duration:  1 day Timing:  Constant Progression:  Unchanged Chronicity:  New Similar to prior headaches: no   Worsened by:  Nothing Ineffective treatments:  None tried Associated symptoms: eye pain   Associated symptoms: no abdominal pain, no back pain, no blurred vision, no cough, no dizziness, no ear pain, no fever, no nausea, no neck pain, no neck stiffness, no seizures, no sore throat, no vomiting and no weakness        Past  Medical History:  Diagnosis Date  . Bone metastasis ([place])   . Cancer [facility])    overlapping sites of right breast  . DVT (deep venous thrombosis) ([place])   . H/O bilateral mastectomy   . 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 Lymphedema of both arms     There are no problems to display for this patient.   No past surgical history on file.   OB History   No obstetric history on file.     No family history on file.  Social History   Tobacco Use  . Smoking status: Former Research scientist (life sciences)  . Smokeless tobacco: Never Used  Substance Use Topics  . Alcohol use: Never  . Drug use: Never    Home Medications Prior to Admission medications   Medication Sig Start Date End Date Taking? Authorizing Provider  carvedilol (COREG) 12.5 MG tablet Take 12.5 mg by mouth 2 (two) times daily. 05/18/20  Yes [provider]  cholecalciferol (VITAMIN D3) 25 MCG (1000 UNIT) tablet Take 1,000 Units by mouth daily.   Yes [provider]  losartan (COZAAR) 25 MG tablet Take 25 mg by mouth daily. 05/10/20  Yes [provider]  oxyCODONE (OXY IR/ROXICODONE) 5 MG immediate release tablet Take 5 mg by mouth every 4 (four) hours as needed (pain).  06/01/20  Yes [provider]  PRESCRIPTION MEDICATION Chemo infusion (cancer treatment) every 3 weeks at [facility]   Yes [provider]  ondansetron (ZOFRAN) 4 MG tablet Take 1 tablet (4 mg total) by  mouth every 8 (eight) hours as needed for nausea or vomiting. 06/08/20   Long, Wonda Old[facility], MD    Allergies    Aspirin, Tomato, Citric acid, and Nabumetone  Review of Systems   Review of Systems  Constitutional: Negative for chills and fever.  HENT: Negative for ear pain and sore throat.   Eyes: Positive for pain. Negative for blurred vision and visual disturbance.  Respiratory: Negative for cough and shortness of breath.   Cardiovascular: Negative for chest pain and palpitations.  Gastrointestinal: Negative for  abdominal pain, nausea and vomiting.  Genitourinary: Negative for dysuria and hematuria.  Musculoskeletal: Negative for arthralgias, back pain, neck pain and neck stiffness.  Skin: Negative for color change and rash.  Neurological: Positive for headaches. Negative for dizziness, seizures, syncope and weakness.  All other systems reviewed and are negative.   Physical Exam Updated Vital Signs BP (!) 153/107 (BP Location: Right Arm)   Pulse 66   Temp 97.7 F (36.5 C) (Oral)   Resp 14   Ht 5' 6"  (1.676 m)   Wt 95.3 kg   SpO2 100%   BMI 33.89 kg/m   Physical Exam Vitals and nursing note reviewed.  Constitutional:      General: She is not in acute distress.    Appearance: She is well-developed.  HENT:     Head: Normocephalic and atraumatic.  Eyes:     Extraocular Movements: Extraocular movements intact.     Conjunctiva/sclera: Conjunctivae normal.     Pupils: Pupils are equal, round, and reactive to light.  Cardiovascular:     Rate and Rhythm: Normal rate and regular rhythm.     Heart sounds: No murmur heard.   Pulmonary:     Effort: Pulmonary effort is normal. No respiratory distress.     Breath sounds: Normal breath sounds.  Abdominal:     Palpations: Abdomen is soft.     Tenderness: There is no abdominal tenderness.  Musculoskeletal:     Cervical back: Neck supple. No rigidity.  Skin:    General: Skin is warm and dry.  Neurological:     Mental Status: She is alert.     Cranial Nerves: No cranial nerve deficit, dysarthria or facial asymmetry.     Motor: No weakness.     Coordination: Coordination normal.     Gait: Gait normal.  Psychiatric:        Mood and Affect: Mood normal.     ED Results / Procedures / Treatments   Labs (all labs ordered are listed, but only abnormal results are displayed) Labs Reviewed - No data to display  EKG None  Radiology CT Head Wo Contrast  Result Date: 06/08/2020 CLINICAL DATA:  Nausea and vomiting since yesterday after  eating bacon. EXAM: CT HEAD WITHOUT CONTRAST CT CERVICAL SPINE WITHOUT CONTRAST TECHNIQUE: Multidetector CT imaging of the head and cervical spine was performed following the standard protocol without intravenous contrast. Multiplanar CT image reconstructions of the cervical spine were also generated. COMPARISON:  None. FINDINGS: CT HEAD FINDINGS Brain: No evidence of acute infarction, hemorrhage, hydrocephalus, extra-axial collection or mass lesion/mass effect. Vascular: No hyperdense vessel or unexpected calcification. Skull: Normal. Negative for fracture or focal lesion. Sinuses/Orbits: Unremarkable CT CERVICAL SPINE FINDINGS Alignment: Unremarkable accounting for positioning Skull base and vertebrae: No acute fracture or bone erosion. Sclerotic lesions in the C7 and T2 vertebrae, primarily affecting the bodies, correlating with chart history of bone cancer. Soft tissues and spinal canal: No prevertebral fluid or swelling. No visible  canal hematoma. 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 No visible mass along the pathway of the recurrent laryngeal nerve. Disc levels: Overall mild degenerative changes without visible cord impingement Upper chest: Radiation fibrosis appearance at the right upper lobe. IMPRESSION: 1. No acute intracranial or cervical spine finding. 2. Osseous metastatic disease, known per the chart. 3. Findings of left vocal cord paresis. Electronically Signed   By: Monte Fantasia M.D.   On: 06/08/2020 04:28   CT Cervical Spine Wo Contrast  Result Date: 06/08/2020 CLINICAL DATA:  Nausea and vomiting since yesterday after eating bacon. EXAM: CT HEAD WITHOUT CONTRAST CT CERVICAL SPINE WITHOUT CONTRAST TECHNIQUE: Multidetector CT imaging of the head and cervical spine was performed following the standard protocol without intravenous contrast. Multiplanar CT image reconstructions of the cervical spine were also generated. COMPARISON:  None. FINDINGS: CT HEAD FINDINGS Brain: No evidence of  acute infarction, hemorrhage, hydrocephalus, extra-axial collection or mass lesion/mass effect. Vascular: No hyperdense vessel or unexpected calcification. Skull: Normal. Negative for fracture or focal lesion. Sinuses/Orbits: Unremarkable CT CERVICAL SPINE FINDINGS Alignment: Unremarkable accounting for positioning Skull base and vertebrae: No acute fracture or bone erosion. Sclerotic lesions in the C7 and T2 vertebrae, primarily affecting the bodies, correlating with chart history of bone cancer. Soft tissues and spinal canal: No prevertebral fluid or swelling. No visible canal hematoma. 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 No visible mass along the pathway of the recurrent laryngeal nerve. Disc levels: Overall mild degenerative changes without visible cord impingement Upper chest: Radiation fibrosis appearance at the right upper lobe. IMPRESSION: 1. No acute intracranial or cervical spine finding. 2. Osseous metastatic disease, known per the chart. 3. Findings of left vocal cord paresis. Electronically Signed   By: Monte Fantasia M.D.   On: 06/08/2020 04:28   MR BRAIN W WO CONTRAST  Result Date: 06/09/2020 CLINICAL DATA:  Breast cancer with osseous metastases. Syncopal episode today. Vomiting. Right-sided headache. Epistaxis. EXAM: MRI HEAD WITHOUT AND WITH CONTRAST TECHNIQUE: Multiplanar, multiecho pulse sequences of the brain and surrounding structures were obtained without and with intravenous contrast. CONTRAST:  9.34m GADAVIST GADOBUTROL 1 MMOL/ML IV SOLN COMPARISON:  CT head without contrast 06/08/2020 FINDINGS: Brain: No acute infarct, hemorrhage, or mass lesion is present. The ventricles are of normal size. No significant extraaxial fluid collection is present. No significant white matter lesions are present. Postcontrast images demonstrate no pathologic enhancement. The brainstem and cerebellum are within normal limits. The internal auditory canals are within normal limits. Vascular: Flow is  present in the major intracranial arteries. Skull and upper cervical spine: The craniocervical junction is normal. Upper cervical spine is within normal limits. Marrow signal is unremarkable. Sinuses/Orbits: Mild mucosal thickening is present in the inferior maxillary sinuses bilaterally. The paranasal sinuses and mastoid air cells are otherwise clear. The globes and orbits are within normal limits. IMPRESSION: 1. Normal MRI appearance of the brain. No evidence for metastatic disease to the brain. 2. Minimal maxillary sinus disease. Electronically Signed   By: CSan MorelleM.D.   On: 06/09/2020 17:34   DG Chest Portable 1 View  Result Date: 06/08/2020 CLINICAL DATA:  Syncope EXAM: PORTABLE CHEST 1 VIEW COMPARISON:  Jan 10, 2020 FINDINGS: The heart size and mediastinal contours are within normal limits. A left-sided MediPort catheter seen with the tip at the superior cavoatrial junction. Both lungs are clear. The visualized skeletal structures are unremarkable. Surgical clips seen within the right axilla. IMPRESSION: No active disease. Electronically Signed   By:  Prudencio Pair M.D.   On: 06/08/2020 03:21    Procedures Procedures (including critical care time)  Medications Ordered in ED Medications  prochlorperazine (COMPAZINE) injection 10 mg (10 mg Intravenous Given 06/09/20 1538)  diphenhydrAMINE (BENADRYL) injection 12.5 mg (12.5 mg Intravenous Given 06/09/20 1541)  acetaminophen (TYLENOL) tablet 1,000 mg (1,000 mg Oral Given 06/09/20 1543)  gadobutrol (GADAVIST) 1 MMOL/ML injection 9.5 mL (9.5 mLs Intravenous Contrast Given 06/09/20 1648)    ED Course  I have reviewed the triage vital signs and the nursing notes.  Pertinent labs & imaging results that were available during my care of the patient were reviewed by me and considered in my medical decision making (see chart for details).    MDM Rules/Calculators/A&P                         Patient is a 49 year old female with history of  metastatic breast cancer who presents to emergency department today for evaluation of a headache at the request of her oncologist. She would like an MRI brain to evaluate for new metastatic disease. Patient's headache is not consistent with SAH, meningitis. She had a negative head CTwithout contrast yesterday.   Plan to give Compazine, Tylenol, Benadryl for migraine cocktail and obtain MRI brain.  Regarding her blood pressure, patient does not have any other symptoms suggestive of hypertensive urgency or emergency other than her headache. Her blood pressure was similarly elevated yesterday. She has been having difficulty controlling her blood pressure since her chemotherapy and is working on this actively with her outpatient providers.  On reassessment, patient's headache improved after medications.  MRI brain normal, notably without any metastatic disease.  Patient feels reassured by this and will follow up with her oncologist for additional concerns.  She is stable for discharge at this time.  Return precautions given.  This patient was seen with Dr. Roxanne Mins. Final Clinical Impression(s) / ED Diagnoses Final diagnoses:  Acute nonintractable headache, unspecified headache type    Rx / DC Orders ED Discharge Orders    None       Asencion Noble, MD 54/00/86 [id number]    Delora Fuel, MD 50/93/26 [phone]

## 2020-06-09 NOTE — ED Triage Notes (Signed)
Pt here for eval of headache, n/v x 3 days. Seen for same yesterday. Has not vomited today. Active cancer, receiving chemo.

## 2020-06-09 NOTE — ED Notes (Signed)
Patient placed in waiting room to wait for Social work to see if a ride can be provided for her home-Monique,RN

## 2020-06-09 NOTE — ED Notes (Signed)
Pt returns from MRI ° °

## 2020-06-09 NOTE — ED Notes (Signed)
Pt st's he headache is much better.  Pt resting with eyes closed at this time

## 2020-06-09 NOTE — ED Notes (Signed)
Pt to MRI at this time.

## 2020-06-09 NOTE — Discharge Instructions (Addendum)
The MRI of your brain was normal.  You may continue to take Tylenol at home for your headache.  Please call your oncologist to discuss your MRI, as well as your blood pressure.  I would suggest taking your blood pressure once a day and keeping a record to show to your doctor. Please return to the emergency department if you have chest pain, shortness of breath, is in your vision, or severe worsening of your headache.

## 2020-06-10 ENCOUNTER — Encounter (HOSPITAL_COMMUNITY): Payer: Self-pay | Admitting: Emergency Medicine

## 2020-06-10 ENCOUNTER — Observation Stay (HOSPITAL_COMMUNITY)
Admission: EM | Admit: 2020-06-10 | Discharge: 2020-06-11 | Disposition: A | Discharge status: Home / Self Care | Payer: Medicare Other | Attending: Internal Medicine | Admitting: Internal Medicine

## 2020-06-10 ENCOUNTER — Other Ambulatory Visit: Payer: Self-pay

## 2020-06-10 DIAGNOSIS — I10 Essential (primary) hypertension: Secondary | ICD-10-CM | POA: Diagnosis present

## 2020-06-10 DIAGNOSIS — Z20822 Contact with and (suspected) exposure to covid-19: Secondary | ICD-10-CM | POA: Insufficient documentation

## 2020-06-10 DIAGNOSIS — Z9221 Personal history of antineoplastic chemotherapy: Secondary | ICD-10-CM | POA: Insufficient documentation

## 2020-06-10 DIAGNOSIS — I160 Hypertensive urgency: Principal | ICD-10-CM | POA: Diagnosis present

## 2020-06-10 DIAGNOSIS — E669 Obesity, unspecified: Secondary | ICD-10-CM | POA: Diagnosis present

## 2020-06-10 DIAGNOSIS — Z79899 Other long term (current) drug therapy: Secondary | ICD-10-CM | POA: Insufficient documentation

## 2020-06-10 DIAGNOSIS — C50911 Malignant neoplasm of unspecified site of right female breast: Secondary | ICD-10-CM | POA: Diagnosis present

## 2020-06-10 DIAGNOSIS — E66811 Obesity, class 1: Secondary | ICD-10-CM | POA: Diagnosis present

## 2020-06-10 DIAGNOSIS — Z87891 Personal history of nicotine dependence: Secondary | ICD-10-CM | POA: Diagnosis not present

## 2020-06-10 DIAGNOSIS — Z1731 Human epidermal growth factor receptor 2 positive status: Secondary | ICD-10-CM | POA: Diagnosis present

## 2020-06-10 DIAGNOSIS — F419 Anxiety disorder, unspecified: Secondary | ICD-10-CM | POA: Diagnosis present

## 2020-06-10 HISTORY — DX: Essential (primary) hypertension: I10

## 2020-06-10 LAB — CBC
HCT: 38.8 % (ref 36.0–46.0)
Hemoglobin: 12.6 g/dL (ref 12.0–15.0)
MCH: 28.6 pg (ref 26.0–34.0)
MCHC: 32.5 g/dL (ref 30.0–36.0)
MCV: 88.2 fL (ref 80.0–100.0)
Platelets: 440 10*3/uL — ABNORMAL HIGH (ref 150–400)
RBC: 4.4 MIL/uL (ref 3.87–5.11)
RDW: 11.9 % (ref 11.5–15.5)
WBC: 4.6 10*3/uL (ref 4.0–10.5)
nRBC: 0 % (ref 0.0–0.2)

## 2020-06-10 LAB — BASIC METABOLIC PANEL
Anion gap: 10 (ref 5–15)
BUN: 8 mg/dL (ref 6–20)
CO2: 26 mmol/L (ref 22–32)
Calcium: 9.8 mg/dL (ref 8.9–10.3)
Chloride: 103 mmol/L (ref 98–111)
Creatinine, Ser: 0.85 mg/dL (ref 0.44–1.00)
GFR, Estimated: >60 mL/min (ref >60)
Glucose, Bld: 85 mg/dL (ref 70–99)
Potassium: 3.8 mmol/L (ref 3.5–5.1)
Sodium: 139 mmol/L (ref 135–145)

## 2020-06-10 LAB — TROPONIN I (HIGH SENSITIVITY): Troponin I (High Sensitivity): 5 ng/L (ref <18)

## 2020-06-10 MED ORDER — CARVEDILOL 3.125 MG PO TABS
12.5000 mg | ORAL_TABLET | Freq: Once | ORAL | Status: AC
  Administered 2020-06-10: 12.5 mg via ORAL
  Filled 2020-06-10: qty 4

## 2020-06-10 MED ORDER — HYDRALAZINE HCL 20 MG/ML IJ SOLN
10.0000 mg | Freq: Once | INTRAMUSCULAR | Status: AC
  Administered 2020-06-10: 10 mg via INTRAVENOUS
  Filled 2020-06-10: qty 1

## 2020-06-10 MED ORDER — HYDRALAZINE HCL 20 MG/ML IJ SOLN: 20.0000 mg | Freq: Once | INTRAMUSCULAR | Status: DC

## 2020-06-10 NOTE — ED Provider Notes (Signed)
[facility] EMERGENCY DEPARTMENT Provider Note   CSN: [id number] Arrival date & time: 06/10/20  1413     History Chief Complaint  Patient presents with  . Hypertension    Maria Monroe is a 49 y.o. female with PMHX breast cancer with bony mets followed at [facility] undergoing maintenance chemotherapy, sarcoidosis, and lymphedema who present to the ED via EMS for HTN. Pt reports that for the past 2-3 months she has been dealing with elevated blood pressures thought to be related to her chemotherapy. She is currently taking 25 mg Losartan once daily and 12.5 Carvedilol BID. She reports that for the past few days her blood pressures have been significantly elevated with the highest reading earlier today of 173/112.   Per chart review this is pt's 3rd ED visit in the past 3 days. Seen initially on 10/07 for syncope and nausea/vomiting. Negative CT Head and CT C spine at that time. Pt's symptoms improved after IVF and Zofran and she was discharged home. She returned yesterday for a headache and was advised by her oncologist to get an MRI Brain with contrast to assess for periorbital mets; negative. She was treated with a HA cocktail with improvement in her HA. BP was noted to be elevated however pt advised to follow up with oncologist.   Pt returns today with continued high blood pressure. She called her oncologist today who advised she take another 25 mg Losartan (total of 50 mg today). Her blood pressure did not improve and she was advised to come to the ED for IV BP meds. She reports her headache is still slightly present however improved after headache cocktail yesterday. She has not had anymore nausea and has not had any more episodes of syncope. Pt denies blurry vision, double vision, nausea, vomiting, neck stiffness, rash, confusion, speech difficulties, chest pain, shortness of breath, or any other associated symptoms.   The history is provided by the patient and medical  records.       Past Medical History:  Diagnosis Date  . Bone metastasis ([place])   . Cancer [facility])    overlapping sites of right breast  . DVT (deep venous thrombosis) ([place])   . H/O bilateral mastectomy   . HER2-positive carcinoma of right breast ([place])   . Hypertension   . Lymphedema of both arms     Patient Active Problem List   Diagnosis Date Noted  . Hypertensive urgency 06/10/2020  . Anxiety 06/10/2020  . Breast cancer, right ([place]) 06/10/2020  . 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 Obesity (BMI 30.0-34.9) 06/10/2020    History reviewed. No pertinent surgical history.   OB History   No obstetric history on file.     No family history on file.  Social History   Tobacco Use  . Smoking status: Former Research scientist (life sciences)  . Smokeless tobacco: Never Used  Substance Use Topics  . Alcohol use: Never  . Drug use: Never    Home Medications Prior to Admission medications   Medication Sig Start Date End Date Taking? Authorizing Provider  carvedilol (COREG) 12.5 MG tablet Take 12.5 mg by mouth 2 (two) times daily. 05/18/20  Yes [provider]  cholecalciferol (VITAMIN D3) 25 MCG (1000 UNIT) tablet Take 1,000 Units by mouth daily.   Yes [provider]  losartan (COZAAR) 25 MG tablet Take 25 mg by mouth daily. 05/10/20  Yes [provider]  oxyCODONE (OXY IR/ROXICODONE) 5 MG immediate release tablet Take 5 mg by mouth  every 4 (four) hours as needed (pain).  06/01/20  Yes [provider]  PRESCRIPTION MEDICATION Chemo infusion (cancer treatment) every 3 weeks at [facility]   Yes [provider]  ondansetron (ZOFRAN) 4 MG tablet Take 1 tablet (4 mg total) by mouth every 8 (eight) hours as needed for nausea or vomiting. 06/08/20   Long, Wonda Olds, MD    Allergies    Aspirin, Tomato, Citric acid, and Nabumetone  Review of Systems   Review of Systems  Constitutional: Negative for chills and fever.  Eyes: Negative for visual  disturbance.  Respiratory: Negative for shortness of breath.   Cardiovascular: Negative for chest pain.  Gastrointestinal: Negative for nausea and vomiting.  Neurological: Positive for headaches (improved). Negative for syncope.  All other systems reviewed and are negative.   Physical Exam Updated Vital Signs BP (!) 177/123 (BP Location: Right Wrist)   Pulse (!) 106   Temp 98.1 F (36.7 C) (Oral)   Resp 20   Ht 5' 6"  (1.676 m)   Wt 95.7 kg   SpO2 100%   BMI 34.06 kg/m   Physical Exam Vitals and nursing note reviewed.  Constitutional:      Appearance: She is not ill-appearing or diaphoretic.  HENT:     Head: Normocephalic and atraumatic.  Eyes:     Extraocular Movements: Extraocular movements intact.     Conjunctiva/sclera: Conjunctivae normal.     Pupils: Pupils are equal, round, and reactive to light.  Cardiovascular:     Rate and Rhythm: Regular rhythm. Tachycardia present.     Pulses: Normal pulses.  Pulmonary:     Effort: Pulmonary effort is normal.     Breath sounds: Normal breath sounds. No wheezing, rhonchi or rales.  Abdominal:     Palpations: Abdomen is soft.     Tenderness: There is no abdominal tenderness. There is no guarding or rebound.  Musculoskeletal:     Cervical back: Neck supple.  Skin:    General: Skin is warm and dry.  Neurological:     Mental Status: She is alert.     Comments: CN 3-12 grossly intact A&O x4 GCS 15 Sensation and strength intact Gait nonataxic including with tandem walking Coordination with finger-to-nose WNL Neg romberg, neg pronator drift     ED Results / Procedures / Treatments   Labs (all labs ordered are listed, but only abnormal results are displayed) Labs Reviewed  CBC - Abnormal; Notable for the following components:      Result Value   Platelets 440 (*)    All other components within normal limits  RESPIRATORY PANEL BY RT PCR (FLU A&B, COVID)  BASIC METABOLIC PANEL  TROPONIN I (HIGH SENSITIVITY)  TROPONIN  I (HIGH SENSITIVITY)    EKG EKG Interpretation  Date/Time:  Sunday June 10 2020 18:26:44 EDT Ventricular Rate:  78 PR Interval:    QRS Duration: 86 QT Interval:  368 QTC Calculation: 420 R Axis:   24 Text Interpretation: Sinus rhythm Abnormal R-wave progression, early transition Left ventricular hypertrophy Baseline wander in lead(s) I III aVR aVL aVF No significant change since last tracing Confirmed by Isla Pence [phone]) on 06/10/2020 6:37:12 PM   Radiology MR BRAIN W WO CONTRAST  Result Date: 06/09/2020 CLINICAL DATA:  Breast cancer with osseous metastases. Syncopal episode today. Vomiting. Right-sided headache. Epistaxis. EXAM: MRI HEAD WITHOUT AND WITH CONTRAST TECHNIQUE: Multiplanar, multiecho pulse sequences of the brain and surrounding structures were obtained without and with intravenous contrast. CONTRAST:  9.45m GADAVIST  GADOBUTROL 1 MMOL/ML IV SOLN COMPARISON:  CT head without contrast 06/08/2020 FINDINGS: Brain: No acute infarct, hemorrhage, or mass lesion is present. The ventricles are of normal size. No significant extraaxial fluid collection is present. No significant white matter lesions are present. Postcontrast images demonstrate no pathologic enhancement. The brainstem and cerebellum are within normal limits. The internal auditory canals are within normal limits. Vascular: Flow is present in the major intracranial arteries. Skull and upper cervical spine: The craniocervical junction is normal. Upper cervical spine is within normal limits. Marrow signal is unremarkable. Sinuses/Orbits: Mild mucosal thickening is present in the inferior maxillary sinuses bilaterally. The paranasal sinuses and mastoid air cells are otherwise clear. The globes and orbits are within normal limits. IMPRESSION: 1. Normal MRI appearance of the brain. No evidence for metastatic disease to the brain. 2. Minimal maxillary sinus disease. Electronically Signed   By: San Morelle M.D.   On:  06/09/2020 17:34    Procedures Procedures (including critical care time)  Medications Ordered in ED Medications  hydrALAZINE (APRESOLINE) injection 10 mg (10 mg Intravenous Given 06/10/20 1824)  carvedilol (COREG) tablet 12.5 mg (12.5 mg Oral Given 06/10/20 2025)    ED Course  I have reviewed the triage vital signs and the nursing notes.  Pertinent labs & imaging results that were available during my care of the patient were reviewed by me and considered in my medical decision making (see chart for details).    MDM Rules/Calculators/A&P                          49 year old female with a history of breast cancer currently undergoing maintenance chemotherapy at [facility] who presents to the ED today with continued elevated blood pressures at home.  Currently on 25 mg losartan 12.5 mg carvedilol twice daily.  Has been seen twice in the ED in the last 2 days for syncope as well as headache.  She has had negative CT head and negative MRI brain with contrast.  She was advised by oncology to come to the ED today for IV antihypertensive medication.  On arrival to the ED patient is afebrile and nontachypneic.  Noted to be tachycardic at 106 however patient does report she is mildly anxious.  Her blood pressure is noted to be 177/123.  She does report she took an additional losartan earlier today for a total of 50 mg.  She is still having a headache however states it has significantly improved since being in the ED yesterday and receiving a headache cocktail.  She has no other complaints.  She has no focal neuro deficits on exam today.  We will plan for EKG, BMP to reassess kidney function, troponin.  We will plan for IV hydralazine and reassessment.   BMP with improving creatinine 0.85; mildly elevated 3 days ago.  Lab Results  Component Value Date   CREATININE 0.85 06/10/2020   CREATININE 1.06 (H) 06/07/2020   CREATININE 0.91 01/10/2020   Troponin of 5. Pt without chest pain or SOB. Do not feel she  needs repeat troponin at this time.   BP decreased to 162/98 after IV hydralazine. Pt takes her 12.5 mg clonidine at 6 PM; will provide at this time.   Unfortunately after clonidine blood pressure has increased again to 178/116. Given this is pt's 3rd ED visit in 3 days and BP unable to be controlled feel she requires admission for hypertensive urgency. Will plan to admit.   Discussed case with  Dr. Tobie Poet who agrees to accept patient for admission.   This note was prepared using Dragon voice recognition software and may include unintentional dictation errors due to the inherent limitations of voice recognition software.  Final Clinical Impression(s) / ED Diagnoses Final diagnoses:  Hypertensive urgency    Rx / DC Orders ED Discharge Orders    None       Eustaquio Maize, PA-C 06/10/20 2215    Isla Pence, MD 06/11/20 [phone]

## 2020-06-10 NOTE — H&P (Addendum)
History and Physical   Satori Krabill WKM:[id number] DOB: [birth year] DOA: 06/10/2020  PCP: [facility] [place]  Patient coming from: home, lives by herself  I have personally briefly reviewed patient's old medical records in [place].  Chief Concern: elevated blood pressure  HPI: Maria Monroe is a 49 y.o. 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  Per home documentation patient's blood pressure with elevated to systolic blood pressure of 174 2 hours post antihypertensive medications.  Review of system was negative for vision changes, fever, chills, cough, shortness of breath, chest pain, abdominal pain, urinary and bowel discomfort, and difficulty walking.  At bedside patient appeared very anxious about her elevated blood pressure.  She reports that she has never seen her blood pressure this high before.  Family history was negative for blood pressure. She was able to have full conversation without difficulty.  ED Course: Discussed with ED provider.  In the ED she was given 1 dose of carvedilol 12.5 mg p.o.  And hydralazine 10 mg IV with minimal improvement of her blood pressure.  Given this is her third ED visit in the last 3 days, ED provider feels that she would benefit from a brief hospitalization in order to improve on her blood pressure management.  06/07/2020-patient was seen for syncope and nausea and vomiting.  Negative CT of the head and CT spine at that time.  Patient symptoms improved after IVF and Zofran and she was discharged home from the ED.  06/09/2020-patient returns for headache and was advised by her oncologist to get MRI of the brain with contrast to assess for periorbital mets which was negative.  She was treated with a headache  cocktail with improvement of her headache.  Patient was noted to have elevated blood pressure however patient was advised to follow-up with oncology.  She was discharged from the emergency room.  She returns today with continued high blood pressure.  She called her oncologist today who advised her to take another 25 mg losartan totaling 50 mg today.  Her blood pressure did not improve in fact increase and she was advised to come to the emergency department for IV blood pressure medications.  She reports her headache is slightly improved today compared to yesterday, however she appears very anxious.  Upon evaluation at bedside, vital monitoring was not on in the patient's room.  Review of Systems: As per HPI otherwise 10 point review of systems negative.  Past Medical History:  Diagnosis Date  . Bone metastasis ([place])   . Cancer [facility])    overlapping sites of right breast  . DVT (deep venous thrombosis) ([place])   . H/O bilateral mastectomy   . 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 Hypertension   . Lymphedema of both arms    History reviewed. No pertinent surgical history.  Social History:  reports that she has quit smoking. She has never used smokeless tobacco. She reports that she does not drink alcohol and does not use drugs.  Allergies  Allergen Reactions  . Aspirin Hives  . Tomato Hives, Itching and Swelling  . Citric Acid Hives  . Nabumetone Other (See Comments)    Throat itching   No family history on file. Family history: Family history reviewed and not pertinent  Prior to Admission medications   Medication Sig Start Date  End Date Taking? Authorizing Provider  carvedilol (COREG) 12.5 MG tablet Take 12.5 mg by mouth 2 (two) times daily. 05/18/20  Yes [provider]  cholecalciferol (VITAMIN D3) 25 MCG (1000 UNIT) tablet Take 1,000 Units by mouth daily.   Yes [provider]  losartan (COZAAR) 25 MG tablet Take 25 mg by mouth daily. 05/10/20  Yes  [provider]  oxyCODONE (OXY IR/ROXICODONE) 5 MG immediate release tablet Take 5 mg by mouth every 4 (four) hours as needed (pain).  06/01/20  Yes [provider]  PRESCRIPTION MEDICATION Chemo infusion (cancer treatment) every 3 weeks at [facility]   Yes [provider]  ondansetron (ZOFRAN) 4 MG tablet Take 1 tablet (4 mg total) by mouth every 8 (eight) hours as needed for nausea or vomiting. 06/08/20   Long, Wonda Olds, MD   Physical Exam: Vitals:   06/10/20 1416  BP: (!) 177/123  Pulse: (!) 106  Resp: 20  Temp: 98.1 F (36.7 C)  TempSrc: Oral  SpO2: 100%  Weight: 95.7 kg  Height: 5' 6" (1.676 m)   Constitutional: NAD, appears anxious  Eyes: PERRL, lids and conjunctivae normal ENMT: Mucous membranes are moist. Posterior pharynx clear of any exudate or lesions.Normal dentition.  Neck: normal, supple, no masses, no thyromegaly Respiratory: clear to auscultation bilaterally, no wheezing, no crackles. Normal respiratory effort. No accessory muscle use.  Cardiovascular: port in left chest, Regular rate and rhythm, no murmurs / rubs / gallops. No extremity edema. 2+ pedal pulses. No carotid bruits.  Abdomen: no tenderness, no masses palpated. No hepatosplenomegaly. Bowel sounds positive.  Musculoskeletal: no clubbing / cyanosis. No joint deformity upper and lower extremities. Good ROM, no contractures. Normal muscle tone.  Skin: no rashes, lesions, ulcers. No induration Neurologic: CN 2-12 grossly intact. Sensation intact, DTR normal. Strength 5/5 in all 4.  Psychiatric: Normal judgment and insight. Alert and oriented x 3. Normal mood.   Labs on Admission: I have personally reviewed following labs and imaging studies  CBC: Recent Labs  Lab 06/07/20 2340  WBC 7.6  HGB 13.1  HCT 41.4  MCV 89.8  PLT 053*   Basic Metabolic Panel: Recent Labs  Lab 06/07/20 2340 06/10/20 1744  NA 140 139  K 4.3 3.8  CL 104 103  CO2 26 26  GLUCOSE 132* 85  BUN 14 8   CREATININE 1.06* 0.85  CALCIUM 10.0 9.8   GFR: Estimated Creatinine Clearance: 94.4 mL/min (by C-G formula based on SCr of 0.85 mg/dL). Liver Function Tests: Recent Labs  Lab 06/07/20 2340  AST 20  ALT 15  ALKPHOS 57  BILITOT 0.4  PROT 8.0  ALBUMIN 3.9   Recent Labs  Lab 06/07/20 2340  LIPASE 36   Urine analysis:    Component Value Date/Time   COLORURINE YELLOW 06/07/2020 2333   APPEARANCEUR HAZY (A) 06/07/2020 2333   LABSPEC 1.031 (H) 06/07/2020 2333   PHURINE 5.0 06/07/2020 2333   GLUCOSEU NEGATIVE 06/07/2020 2333   HGBUR NEGATIVE 06/07/2020 2333   [place] NEGATIVE 06/07/2020 2333   KETONESUR NEGATIVE 06/07/2020 2333   PROTEINUR 30 (A) 06/07/2020 2333   NITRITE NEGATIVE 06/07/2020 2333   LEUKOCYTESUR SMALL (A) 06/07/2020 2333   Radiological Exams on Admission: Personally reviewed and I agree with radiologist reading as below.  MR BRAIN W WO CONTRAST  Result Date: 06/09/2020 CLINICAL DATA:  Breast cancer with osseous metastases. Syncopal episode today. Vomiting. Right-sided headache. Epistaxis. EXAM: MRI HEAD WITHOUT AND WITH CONTRAST TECHNIQUE: Multiplanar, multiecho pulse sequences  of the brain and surrounding structures were obtained without and with intravenous contrast. CONTRAST:  9.20m GADAVIST GADOBUTROL 1 MMOL/ML IV SOLN COMPARISON:  CT head without contrast 06/08/2020 FINDINGS: Brain: No acute infarct, hemorrhage, or mass lesion is present. The ventricles are of normal size. No significant extraaxial fluid collection is present. No significant white matter lesions are present. Postcontrast images demonstrate no pathologic enhancement. The brainstem and cerebellum are within normal limits. The internal auditory canals are within normal limits. Vascular: Flow is present in the major intracranial arteries. Skull and upper cervical spine: The craniocervical junction is normal. Upper cervical spine is within normal limits. Marrow signal is unremarkable.  Sinuses/Orbits: Mild mucosal thickening is present in the inferior maxillary sinuses bilaterally. The paranasal sinuses and mastoid air cells are otherwise clear. The globes and orbits are within normal limits. IMPRESSION: 1. Normal MRI appearance of the brain. No evidence for metastatic disease to the brain. 2. Minimal maxillary sinus disease. Electronically Signed   By: CSan MorelleM.D.   On: 06/09/2020 17:34   EKG: Independently reviewed, showing sinus rhythm with rate of 78, LVH  Assessment/Plan  Ms. WIglesiais a 49year old female with right HER2 positive breast cancer on maintenance chemo, bone metastasis, hypertension presumed induced by chemotherapy, presents with chief concern of elevated blood pressure at home with peak of SBP 174.   Principal Problem:   Hypertensive urgency Active Problems:   Anxiety   Breast cancer, right (H[facility])   HER2-positive carcinoma of right breast ([place]   # hypertensive urgency - Counseled patient regarding anxiety - s/p coreg 12.5 mg PO in the ED and hydralazine 10 mg IV once - increased home losartan from 25 mg daily to 50 mg daily - Resumed home coreg at 12.5 mg BID  - metoprolol tartrate 5 mg prn q6h f - Admit to observation with telemetry - Check TSH - BMP in the AM  # Anxiety - outpatient for initiation of SSRi  # HER2 positive [facility] cancer [facility]   DVT prophylaxis: enoxaparin  Code Status: full  Diet: cardiac Family Communication: daughter at bedside Disposition Plan: likely 10/11 Consults called: none indicated at this time Admission status: observation  Amy N Cox D.O. [facility]  If 7AM-7PM, please contact day-coverage provider [email or web address]  06/10/2020, 9:43 PM

## 2020-06-10 NOTE — ED Triage Notes (Signed)
Pt here by [facility].  States this is her 3rd visit in the last 3 days for HTN.  Reports intermittent pressure to R side of head but denies any at present.  No neuro deficits noted.  Vomited on Friday that she states has resolved.

## 2020-06-11 DIAGNOSIS — I160 Hypertensive urgency: Secondary | ICD-10-CM | POA: Diagnosis not present

## 2020-06-11 LAB — RESPIRATORY PANEL BY RT PCR (FLU A&B, COVID)
Influenza A by PCR: NEGATIVE
Influenza B by PCR: NEGATIVE
SARS Coronavirus 2 by RT PCR: NEGATIVE

## 2020-06-11 MED ORDER — ENOXAPARIN SODIUM 40 MG/0.4ML ~~LOC~~ SOLN: 40.0000 mg | Freq: Every day | SUBCUTANEOUS | Status: DC

## 2020-06-11 MED ORDER — ONDANSETRON HCL 4 MG PO TABS: 4.0000 mg | ORAL_TABLET | Freq: Three times a day (TID) | ORAL | Status: DC | PRN

## 2020-06-11 MED ORDER — METOPROLOL TARTRATE 5 MG/5ML IV SOLN: 5.0000 mg | Freq: Four times a day (QID) | INTRAVENOUS | Status: DC

## 2020-06-11 MED ORDER — VITAMIN D 25 MCG (1000 UNIT) PO TABS
1000.0000 [IU] | ORAL_TABLET | Freq: Every day | ORAL | Status: DC
  Administered 2020-06-11: 1000 [IU] via ORAL
  Filled 2020-06-11: qty 1

## 2020-06-11 MED ORDER — LOSARTAN POTASSIUM 50 MG PO TABS
50.0000 mg | ORAL_TABLET | Freq: Every day | ORAL | 0 refills | Status: DC
Start: 2020-06-11 — End: 2022-04-14

## 2020-06-11 MED ORDER — LOSARTAN POTASSIUM 50 MG PO TABS
50.0000 mg | ORAL_TABLET | Freq: Every day | ORAL | Status: DC
  Administered 2020-06-11: 50 mg via ORAL
  Filled 2020-06-11: qty 1

## 2020-06-11 MED ORDER — HEPARIN SOD (PORK) LOCK FLUSH 100 UNIT/ML IV SOLN
500.0000 [IU] | Freq: Once | INTRAVENOUS | Status: DC
  Filled 2020-06-11: qty 5

## 2020-06-11 MED ORDER — CARVEDILOL 3.125 MG PO TABS
12.5000 mg | ORAL_TABLET | Freq: Two times a day (BID) | ORAL | Status: DC
  Administered 2020-06-11: 12.5 mg via ORAL
  Filled 2020-06-11: qty 4

## 2020-06-11 MED ORDER — OXYCODONE HCL 5 MG PO TABS: 5.0000 mg | ORAL_TABLET | ORAL | Status: DC | PRN

## 2020-06-11 NOTE — ED Notes (Signed)
Discharge instructions reviewed with patient, discharged from ED in NAD

## 2020-06-11 NOTE — Discharge Summary (Signed)
Physician Discharge Summary  Maria Monroe ALP:[id number] DOB: [birth year] DOA: 06/10/2020  PCP: [facility]  Admit date: 06/10/2020 Discharge date: 06/11/2020  Admitted From: Home Disposition: Home  Recommendations for Outpatient Follow-up:  1. Follow up with PCP as scheduled, oncology as scheduled  Home Health: None Equipment/Devices: None  Discharge Condition: Stable CODE STATUS: Full Diet recommendation: As tolerated  Brief/Interim Summary: Maria Monroe is a 49 y.o. 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 Per home documentation patient's blood pressure with elevated to systolic blood pressure of 174 2 hours post antihypertensive medications.  Review of system was negative for vision changes, fever, chills, cough, shortness of breath, chest pain, abdominal pain, urinary and bowel discomfort, and difficulty walking. At bedside patient appeared very anxious about her elevated blood pressure.  She reports that she has never seen her blood pressure this high before.  Family history was negative for blood pressure. She was able to have full conversation without difficulty. In the ED she was given 1 dose of carvedilol 12.5 mg p.o.  And hydralazine 10 mg IV with minimal improvement of her blood pressure.  Given this is her third ED visit in the last 3 days, ED provider feels that she would benefit from a brief hospitalization in order to improve on her blood pressure management. 06/07/2020-patient was seen for syncope and nausea and vomiting.  Negative CT of the head and CT spine at that time.  Patient symptoms improved after IVF and Zofran and she was discharged home from the ED; 06/09/2020-patient returns for headache and was advised by her oncologist to  get MRI of the brain with contrast to assess for periorbital mets which was negative.  She was treated with a headache cocktail with improvement of her headache.  Patient was noted to have elevated blood pressure however patient was advised to follow-up with oncology.  She was discharged from the emergency room. She returns today with continued high blood pressure.  She called her oncologist today who advised her to take another 25 mg losartan totaling 50 mg today.  Her blood pressure did not improve in fact increase and she was advised to come to the emergency department for IV blood pressure medications.  She reports her headache is slightly improved today compared to yesterday, however she appears very anxious.  Patient admitted as above with acutely worsened hypertension concerning for hypertensive urgency versus emergency given the previous episode of syncope nausea vomiting.  At this time patient is back to baseline, mild increase in home medications including increase of losartan to 50 mg dose, otherwise patient's blood pressure was moderately well controlled this morning patient is asymptomatic and otherwise stable and agreeable for discharge.  Close follow-up with PCP and oncology as scheduled.  Discharge Diagnoses:  Principal Problem:   Hypertensive urgency Active Problems:   Anxiety   Breast cancer, right (HCC)   HER2-positive carcinoma of right breast ([place])   Obesity (BMI 30.0-34.9)    Discharge Instructions   Allergies as of 06/11/2020      Reactions   Aspirin Hives   Tomato Hives, Itching, Swelling   Citric Acid Hives   Nabumetone Other (See Comments)   Throat itching      Medication List    TAKE these medications   carvedilol 12.5 MG tablet Commonly known as: COREG Take 12.5 mg by  mouth 2 (two) times daily.   cholecalciferol 25 MCG (1000 UNIT) tablet Commonly known as: VITAMIN D3 Take 1,000 Units by mouth daily.   losartan 50 MG tablet Commonly known as:  COZAAR Take 1 tablet (50 mg total) by mouth daily. What changed:   medication strength  how much to take   ondansetron 4 MG tablet Commonly known as: ZOFRAN Take 1 tablet (4 mg total) by mouth every 8 (eight) hours as needed for nausea or vomiting.   oxyCODONE 5 MG immediate release tablet Commonly known as: Oxy IR/ROXICODONE Take 5 mg by mouth every 4 (four) hours as needed (pain).   PRESCRIPTION MEDICATION Chemo infusion (cancer treatment) every 3 weeks at [facility]  Allergen Reactions  . Aspirin Hives  . Tomato Hives, Itching and Swelling  . Citric Acid Hives  . Nabumetone Other (See Comments)    Throat itching    Consultations: None  Procedures/Studies: CT Head Wo Contrast  Result Date: 06/08/2020 CLINICAL DATA:  Nausea and vomiting since yesterday after eating bacon. EXAM: CT HEAD WITHOUT CONTRAST CT CERVICAL SPINE WITHOUT CONTRAST TECHNIQUE: Multidetector CT imaging of the head and cervical spine was performed following the standard protocol without intravenous contrast. Multiplanar CT image reconstructions of the cervical spine were also generated. COMPARISON:  None. FINDINGS: CT HEAD FINDINGS Brain: No evidence of acute infarction, hemorrhage, hydrocephalus, extra-axial collection or mass lesion/mass effect. Vascular: No hyperdense vessel or unexpected calcification. Skull: Normal. Negative for fracture or focal lesion. Sinuses/Orbits: Unremarkable CT CERVICAL SPINE FINDINGS Alignment: Unremarkable accounting for positioning Skull base and vertebrae: No acute fracture or bone erosion. Sclerotic lesions in the C7 and T2 vertebrae, primarily affecting the bodies, correlating with chart history of bone cancer. Soft tissues and spinal canal: No prevertebral fluid or swelling. No visible canal hematoma. 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 No visible mass along the pathway of the recurrent laryngeal nerve. Disc levels: Overall mild degenerative changes  without visible cord impingement Upper chest: Radiation fibrosis appearance at the right upper lobe. IMPRESSION: 1. No acute intracranial or cervical spine finding. 2. Osseous metastatic disease, known per the chart. 3. Findings of left vocal cord paresis. Electronically Signed   By: Monte Fantasia M.D.   On: 06/08/2020 04:28   CT Cervical Spine Wo Contrast  Result Date: 06/08/2020 CLINICAL DATA:  Nausea and vomiting since yesterday after eating bacon. EXAM: CT HEAD WITHOUT CONTRAST CT CERVICAL SPINE WITHOUT CONTRAST TECHNIQUE: Multidetector CT imaging of the head and cervical spine was performed following the standard protocol without intravenous contrast. Multiplanar CT image reconstructions of the cervical spine were also generated. COMPARISON:  None. FINDINGS: CT HEAD FINDINGS Brain: No evidence of acute infarction, hemorrhage, hydrocephalus, extra-axial collection or mass lesion/mass effect. Vascular: No hyperdense vessel or unexpected calcification. Skull: Normal. Negative for fracture or focal lesion. Sinuses/Orbits: Unremarkable CT CERVICAL SPINE FINDINGS Alignment: Unremarkable accounting for positioning Skull base and vertebrae: No acute fracture or bone erosion. Sclerotic lesions in the C7 and T2 vertebrae, primarily affecting the bodies, correlating with chart history of bone cancer. Soft tissues and spinal canal: No prevertebral fluid or swelling. No visible canal hematoma. 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 No visible mass along the pathway of the recurrent laryngeal nerve. Disc levels: Overall mild degenerative changes without visible cord impingement Upper chest: Radiation fibrosis appearance at the right upper lobe. IMPRESSION: 1. No acute intracranial or cervical spine finding. 2. Osseous metastatic disease, known per the  chart. 3. Findings of left vocal cord paresis. Electronically Signed   By: Monte Fantasia M.D.   On: 06/08/2020 04:28   MR BRAIN W WO CONTRAST  Result Date:  06/09/2020 CLINICAL DATA:  Breast cancer with osseous metastases. Syncopal episode today. Vomiting. Right-sided headache. Epistaxis. EXAM: MRI HEAD WITHOUT AND WITH CONTRAST TECHNIQUE: Multiplanar, multiecho pulse sequences of the brain and surrounding structures were obtained without and with intravenous contrast. CONTRAST:  9.73m GADAVIST GADOBUTROL 1 MMOL/ML IV SOLN COMPARISON:  CT head without contrast 06/08/2020 FINDINGS: Brain: No acute infarct, hemorrhage, or mass lesion is present. The ventricles are of normal size. No significant extraaxial fluid collection is present. No significant white matter lesions are present. Postcontrast images demonstrate no pathologic enhancement. The brainstem and cerebellum are within normal limits. The internal auditory canals are within normal limits. Vascular: Flow is present in the major intracranial arteries. Skull and upper cervical spine: The craniocervical junction is normal. Upper cervical spine is within normal limits. Marrow signal is unremarkable. Sinuses/Orbits: Mild mucosal thickening is present in the inferior maxillary sinuses bilaterally. The paranasal sinuses and mastoid air cells are otherwise clear. The globes and orbits are within normal limits. IMPRESSION: 1. Normal MRI appearance of the brain. No evidence for metastatic disease to the brain. 2. Minimal maxillary sinus disease. Electronically Signed   By: CSan MorelleM.D.   On: 06/09/2020 17:34   DG Chest Portable 1 View  Result Date: 06/08/2020 CLINICAL DATA:  Syncope EXAM: PORTABLE CHEST 1 VIEW COMPARISON:  Jan 10, 2020 FINDINGS: The heart size and mediastinal contours are within normal limits. A left-sided MediPort catheter seen with the tip at the superior cavoatrial junction. Both lungs are clear. The visualized skeletal structures are unremarkable. Surgical clips seen within the right axilla. IMPRESSION: No active disease. Electronically Signed   By: BPrudencio PairM.D.   On: 06/08/2020  03:21     Subjective: No acute issues or events denies headache fevers chills nausea vomiting chest pain or shortness of breath   Discharge Exam: Vitals:   06/11/20 0930 06/11/20 1014  BP: 120/76   Pulse: 90   Resp: 14   Temp:  98.2 F (36.8 C)  SpO2: 100%    Vitals:   06/11/20 0803 06/11/20 0858 06/11/20 0930 06/11/20 1014  BP: (!) 156/102 132/79 120/76   Pulse: 85  90   Resp: 15  14   Temp:    98.2 F (36.8 C)  TempSrc:      SpO2: 100%  100%   Weight:      Height:        General: Pt is alert, awake, not in acute distress Cardiovascular: RRR, S1/S2 +, no rubs, no gallops Respiratory: CTA bilaterally, no wheezing, no rhonchi Abdominal: Soft, NT, ND, bowel sounds + Extremities: no edema, no cyanosis    The results of significant diagnostics from this hospitalization (including imaging, microbiology, ancillary and laboratory) are listed below for reference.     Microbiology: Recent Results (from the past 240 hour(s))  Respiratory Panel by RT PCR (Flu A&B, Covid) - Nasopharyngeal Swab     Status: None   Collection Time: 06/11/20 12:17 AM   Specimen: Nasopharyngeal Swab  Result Value Ref Range Status   SARS Coronavirus 2 by RT PCR NEGATIVE NEGATIVE Final    Comment: (NOTE) SARS-CoV-2 target nucleic acids are NOT DETECTED.  The SARS-CoV-2 RNA is generally detectable in upper respiratoy specimens during the acute phase of infection. The lowest concentration of SARS-CoV-2 viral copies  this assay can detect is 131 copies/mL. A negative result does not preclude SARS-Cov-2 infection and should not be used as the sole basis for treatment or other patient management decisions. A negative result may occur with  improper specimen collection/handling, submission of specimen other than nasopharyngeal swab, presence of viral mutation(s) within the areas targeted by this assay, and inadequate number of viral copies (<131 copies/mL). A negative result must be combined with  clinical observations, patient history, and epidemiological information. The expected result is Negative.  Fact Sheet for Patients:  PinkCheek.be  Fact Sheet for Healthcare Providers:  GravelBags.it  This test is no t yet approved or cleared by the [place] FDA and  has been authorized for detection and/or diagnosis of SARS-CoV-2 by FDA under an Emergency Use Authorization (EUA). This EUA will remain  in effect (meaning this test can be used) for the duration of the COVID-19 declaration under Section 564(b)(1) of the Act, 21 U.S.C. section 360bbb-3(b)(1), unless the authorization is terminated or revoked sooner.     Influenza A by PCR NEGATIVE NEGATIVE Final   Influenza B by PCR NEGATIVE NEGATIVE Final    Comment: (NOTE) The Xpert Xpress SARS-CoV-2/FLU/RSV assay is intended as an aid in  the diagnosis of influenza from Nasopharyngeal swab specimens and  should not be used as a sole basis for treatment. Nasal washings and  aspirates are unacceptable for Xpert Xpress SARS-CoV-2/FLU/RSV  testing.  Fact Sheet for Patients: PinkCheek.be  Fact Sheet for Healthcare Providers: GravelBags.it  This test is not yet approved or cleared by the [place] FDA and  has been authorized for detection and/or diagnosis of SARS-CoV-2 by  FDA under an Emergency Use Authorization (EUA). This EUA will remain  in effect (meaning this test can be used) for the duration of the  Covid-19 declaration under Section 564(b)(1) of the Act, 21  U.S.C. section 360bbb-3(b)(1), unless the authorization is  terminated or revoked. Performed at [facility], [place] [address] [place], [place] [address]      Labs: BNP (last 3 results) No results for input(s): BNP in the last 8760 hours. Basic Metabolic Panel: Recent Labs  Lab 06/07/20 2340 06/10/20 1744  NA 140 139  K 4.3 3.8   CL 104 103  CO2 26 26  GLUCOSE 132* 85  BUN 14 8  CREATININE 1.06* 0.85  CALCIUM 10.0 9.8   Liver Function Tests: Recent Labs  Lab 06/07/20 2340  AST 20  ALT 15  ALKPHOS 57  BILITOT 0.4  PROT 8.0  ALBUMIN 3.9   Recent Labs  Lab 06/07/20 2340  LIPASE 36   No results for input(s): AMMONIA in the last 168 hours. CBC: Recent Labs  Lab 06/07/20 2340 06/10/20 2205  WBC 7.6 4.6  HGB 13.1 12.6  HCT 41.4 38.8  MCV 89.8 88.2  PLT 460* 440*   Cardiac Enzymes: No results for input(s): CKTOTAL, CKMB, CKMBINDEX, TROPONINI in the last 168 hours. BNP: Invalid input(s): POCBNP CBG: No results for input(s): GLUCAP in the last 168 hours. D-Dimer No results for input(s): DDIMER in the last 72 hours. Hgb A1c No results for input(s): HGBA1C in the last 72 hours. Lipid Profile No results for input(s): CHOL, HDL, LDLCALC, TRIG, CHOLHDL, LDLDIRECT in the last 72 hours. Thyroid function studies No results for input(s): TSH, T4TOTAL, T3FREE, THYROIDAB in the last 72 hours.  Invalid input(s): FREET3 Anemia work up No results for input(s): VITAMINB12, FOLATE, FERRITIN, TIBC, IRON, RETICCTPCT in the last 72 hours. Urinalysis  Component Value Date/Time   COLORURINE YELLOW 06/07/2020 2333   APPEARANCEUR HAZY (A) 06/07/2020 2333   LABSPEC 1.031 (H) 06/07/2020 2333   PHURINE 5.0 06/07/2020 2333   GLUCOSEU NEGATIVE 06/07/2020 2333   HGBUR NEGATIVE 06/07/2020 2333   BILIRUBINUR NEGATIVE 06/07/2020 2333   KETONESUR NEGATIVE 06/07/2020 2333   PROTEINUR 30 (A) 06/07/2020 2333   NITRITE NEGATIVE 06/07/2020 2333   LEUKOCYTESUR SMALL (A) 06/07/2020 2333   Sepsis Labs Invalid input(s): PROCALCITONIN,  WBC,  LACTICIDVEN Microbiology Recent Results (from the past 240 hour(s))  Respiratory Panel by RT PCR (Flu A&B, Covid) - Nasopharyngeal Swab     Status: None   Collection Time: 06/11/20 12:17 AM   Specimen: Nasopharyngeal Swab  Result Value Ref Range Status   SARS Coronavirus 2 by  RT PCR NEGATIVE NEGATIVE Final    Comment: (NOTE) SARS-CoV-2 target nucleic acids are NOT DETECTED.  The SARS-CoV-2 RNA is generally detectable in upper respiratoy specimens during the acute phase of infection. The lowest concentration of SARS-CoV-2 viral copies this assay can detect is 131 copies/mL. A negative result does not preclude SARS-Cov-2 infection and should not be used as the sole basis for treatment or other patient management decisions. A negative result may occur with  improper specimen collection/handling, submission of specimen other than nasopharyngeal swab, presence of viral mutation(s) within the areas targeted by this assay, and inadequate number of viral copies (<131 copies/mL). A negative result must be combined with clinical observations, patient history, and epidemiological information. The expected result is Negative.  Fact Sheet for Patients:  PinkCheek.be  Fact Sheet for Healthcare Providers:  GravelBags.it  This test is no t yet approved or cleared by the [place] FDA and  has been authorized for detection and/or diagnosis of SARS-CoV-2 by FDA under an Emergency Use Authorization (EUA). This EUA will remain  in effect (meaning this test can be used) for the duration of the COVID-19 declaration under Section 564(b)(1) of the Act, 21 U.S.C. section 360bbb-3(b)(1), unless the authorization is terminated or revoked sooner.     Influenza A by PCR NEGATIVE NEGATIVE Final   Influenza B by PCR NEGATIVE NEGATIVE Final    Comment: (NOTE) The Xpert Xpress SARS-CoV-2/FLU/RSV assay is intended as an aid in  the diagnosis of influenza from Nasopharyngeal swab specimens and  should not be used as a sole basis for treatment. Nasal washings and  aspirates are unacceptable for Xpert Xpress SARS-CoV-2/FLU/RSV  testing.  Fact Sheet for Patients: PinkCheek.be  Fact Sheet for  Healthcare Providers: GravelBags.it  This test is not yet approved or cleared by the [place] FDA and  has been authorized for detection and/or diagnosis of SARS-CoV-2 by  FDA under an Emergency Use Authorization (EUA). This EUA will remain  in effect (meaning this test can be used) for the duration of the  Covid-19 declaration under Section 564(b)(1) of the Act, 21  U.S.C. section 360bbb-3(b)(1), unless the authorization is  terminated or revoked. Performed at [facility], [place] [address], [place], [place] [address]      Time coordinating discharge: Over 30 minutes  SIGNED:   Little Ishikawa, DO [facility] 06/11/2020, 6:36 PM Pager   If 7PM-7AM, please contact night-coverage [email or web address]

## 2020-08-20 ENCOUNTER — Other Ambulatory Visit: Payer: Self-pay | Admitting: Internal Medicine

## 2020-08-20 DIAGNOSIS — C50911 Malignant neoplasm of unspecified site of right female breast: Secondary | ICD-10-CM

## 2020-08-29 ENCOUNTER — Ambulatory Visit
Admission: RE | Admit: 2020-08-29 | Discharge: 2020-08-29 | Disposition: A | Discharge status: Home / Self Care | Payer: Medicare Other | Source: Ambulatory Visit | Attending: Internal Medicine | Admitting: Internal Medicine

## 2020-08-29 ENCOUNTER — Other Ambulatory Visit: Payer: Self-pay

## 2020-08-29 DIAGNOSIS — C7951 Secondary malignant neoplasm of bone: Secondary | ICD-10-CM | POA: Insufficient documentation

## 2020-08-29 DIAGNOSIS — C50911 Malignant neoplasm of unspecified site of right female breast: Secondary | ICD-10-CM

## 2020-08-29 IMAGING — MR MR HEAD WO/W CM
15 series · 48 of 48 positions shown · IV contrast (gadavist)
Comparison: MRI of the brain [DATE].

CLINICAL DATA: Headaches.  History of breast cancer.

EXAM:
MRI HEAD WITHOUT AND WITH CONTRAST
TECHNIQUE: Multiplanar, multiecho pulse sequences of the brain and surrounding
structures were obtained without and with intravenous contrast.
CONTRAST:  10mL GADAVIST GADOBUTROL 1 MMOL/ML IV SOLN

[Series 5: ax dwi_tracew · axial · 3.0mm · 0.60mm/px · z∈[-151,+2]mm · 2 of 48 slices shown]
[im 1/48]
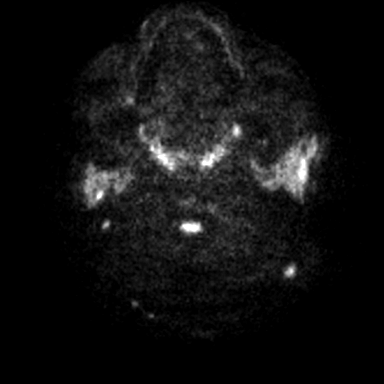
[im 48/48]
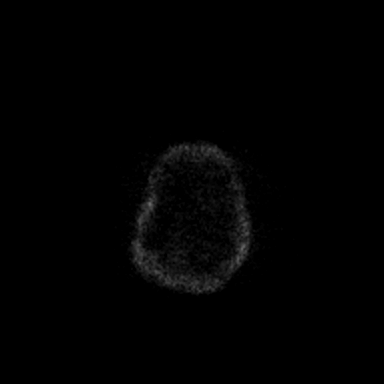

[Series 6: ax dwi_adc · axial · 3.0mm · 0.60mm/px · z∈[-151,+2]mm · 2 of 48 slices shown]
[im 1/48]
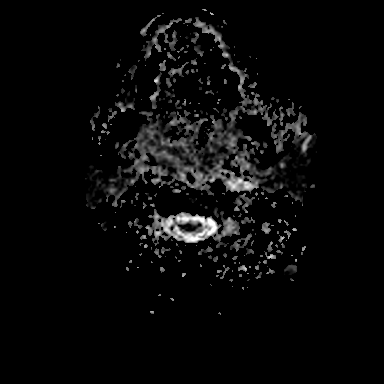
[im 48/48]
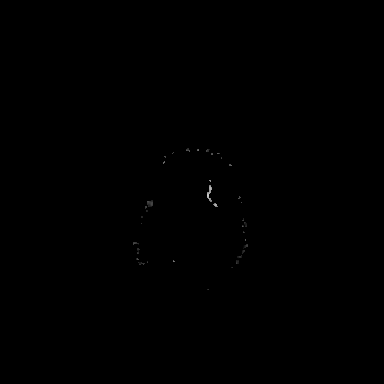

[Series 7: cor dwi_tracew · coronal · 5.0mm · 0.60mm/px · 2 of 34 slices shown]
[im 1/34]
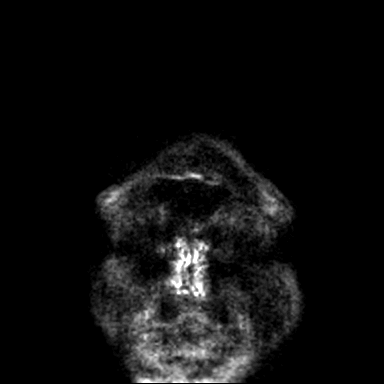
[im 34/34]
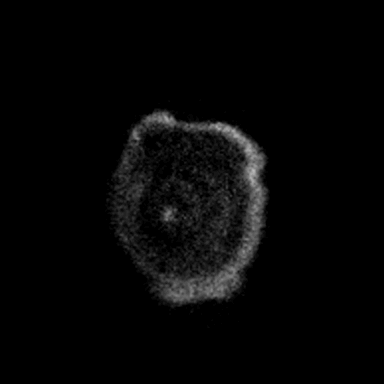

[Series 8: cor dwi_adc · coronal · 5.0mm · 0.60mm/px · 2 of 34 slices shown]
[im 1/34]
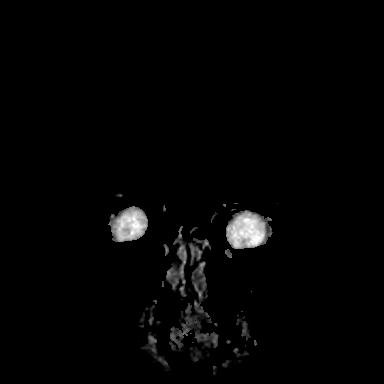
[im 34/34]
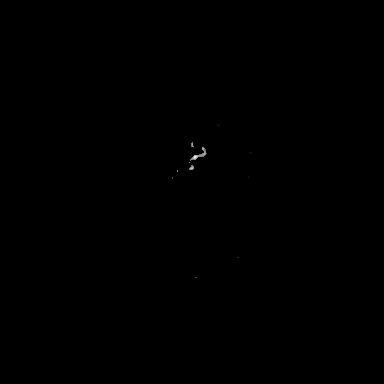

[Series 9: T1 · sagittal · 5.0mm · 0.62mm/px · 1 of 22 slices shown (1 of 2)]
[im 1/22]
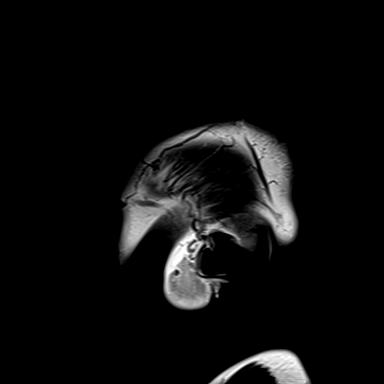

[Series 10: T2 · axial · 5.0mm · 0.53mm/px · 1 of 25 slices shown]
[im 1/25]
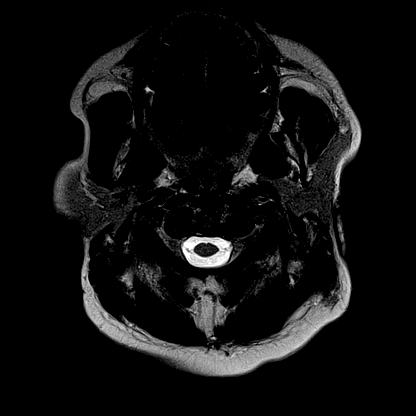

[Series 11: mag_images · axial · 3.0mm · 0.90mm/px · z∈[-162,+14]mm · 4 of 60 slices shown]
[im 1/60]
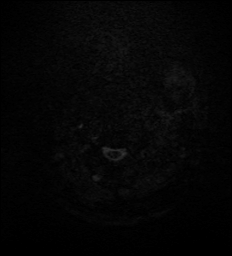
[im 20/60]
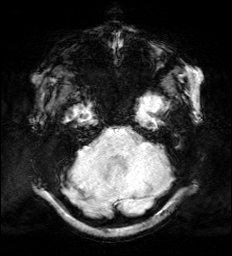
[im 40/60]
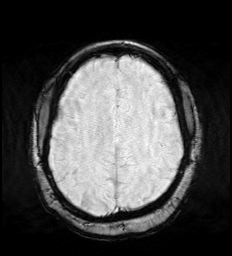
[im 60/60]
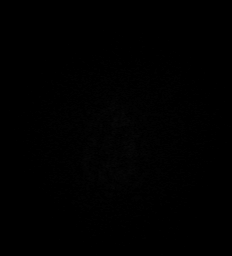

[Series 12: pha_images · axial · 3.0mm · 0.90mm/px · z∈[-162,+11]mm · 4 of 59 slices shown]
[im 1/59]
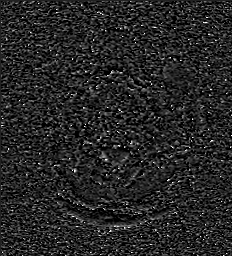
[im 20/59]
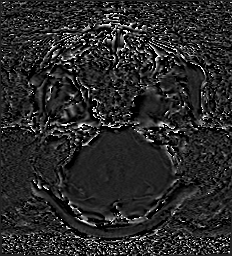
[im 39/59]
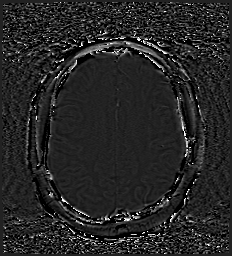
[im 59/59]
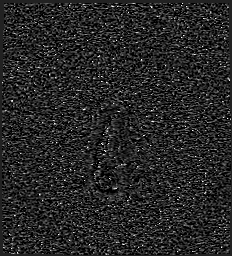

[Series 13: swi_images · axial · 3.0mm · 0.90mm/px · z∈[-162,+14]mm · 4 of 60 slices shown]
[im 1/60]
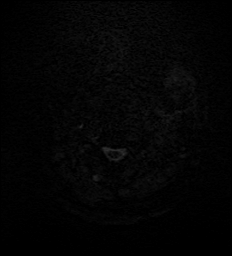
[im 20/60]
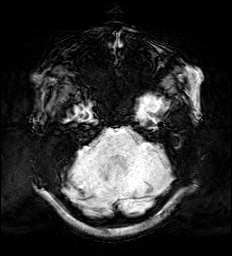
[im 40/60]
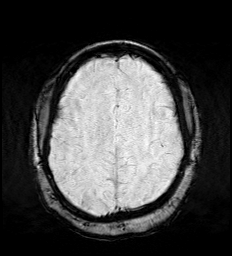
[im 60/60]
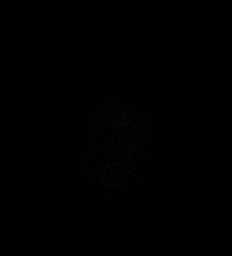

[Series 19: FLAIR · axial · 3.0mm · 0.53mm/px · z∈[-172,-18]mm · 3 of 53 slices shown]
[im 1/53]
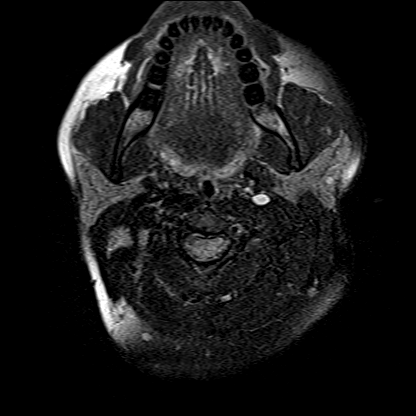
[im 27/53]
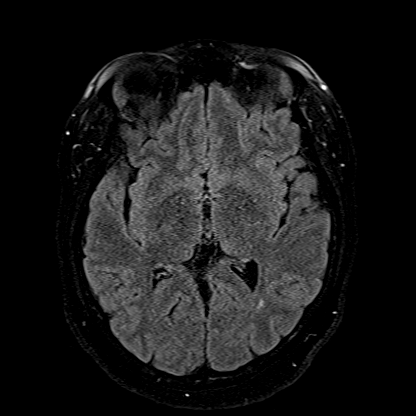
[im 53/53]
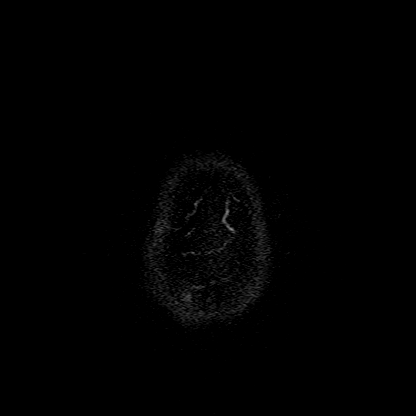

[Series 20: T1 · axial · 1.0mm · 0.98mm/px · z∈[-168,-27]mm · 9 of 144 slices shown (2 of 2)]
[im 1/144]
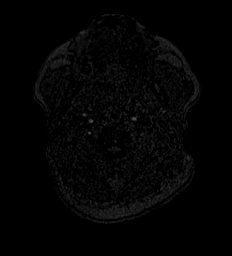
[im 18/144]
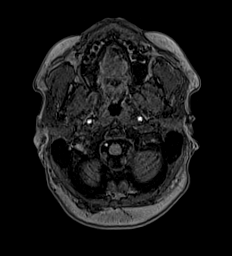
[im 36/144]
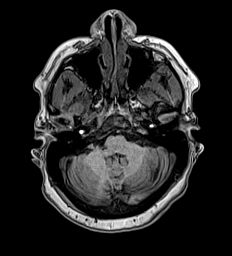
[im 54/144]
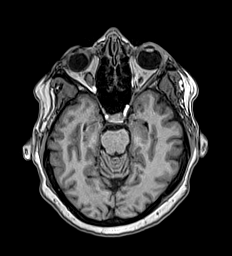
[im 72/144]
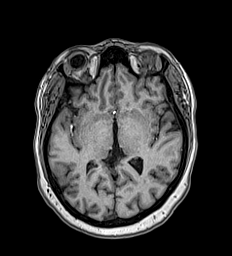
[im 90/144]
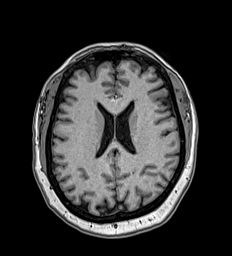
[im 108/144]
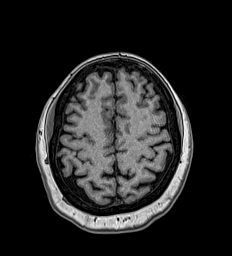
[im 126/144]
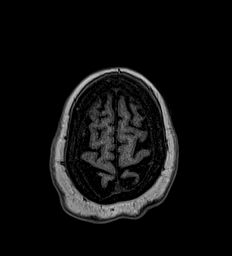
[im 144/144]
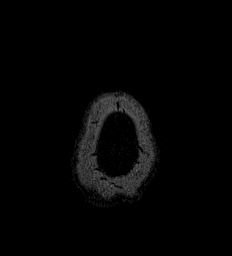

[Series 21: T2 post-contrast · coronal · 5.0mm · 0.57mm/px · 2 of 27 slices shown]
[im 1/27]
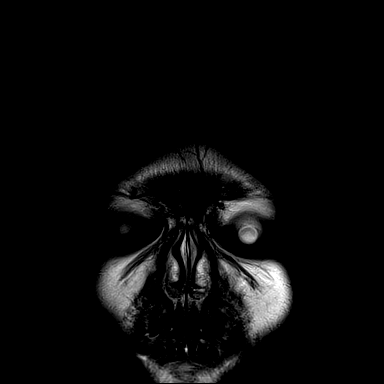
[im 27/27]
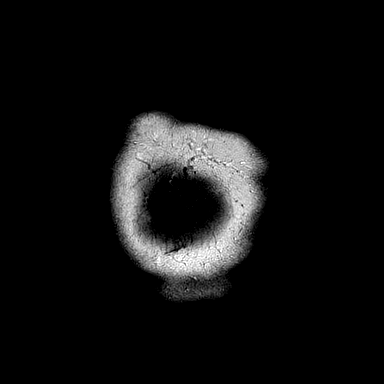

[Series 22: T1 post-contrast · axial · 1.0mm · 0.98mm/px · z∈[-168,-27]mm · 9 of 144 slices shown (1 of 3)]
[im 1/144]
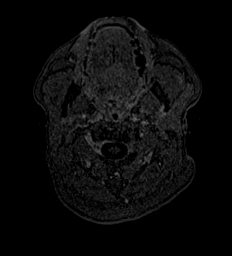
[im 18/144]
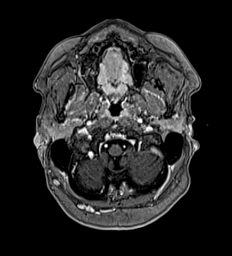
[im 36/144]
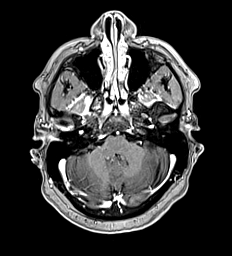
[im 54/144]
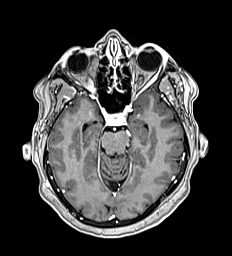
[im 72/144]
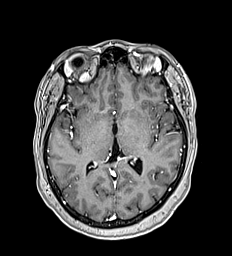
[im 90/144]
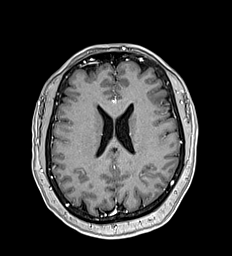
[im 108/144]
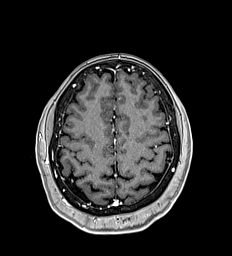
[im 126/144]
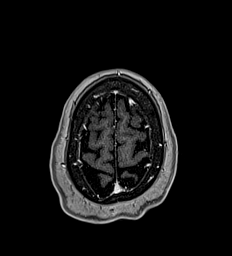
[im 144/144]
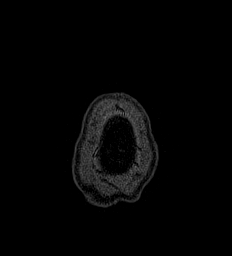

[Series 23: T1 post-contrast · coronal · 5.0mm · 0.57mm/px · 2 of 27 slices shown (2 of 3)]
[im 1/27]
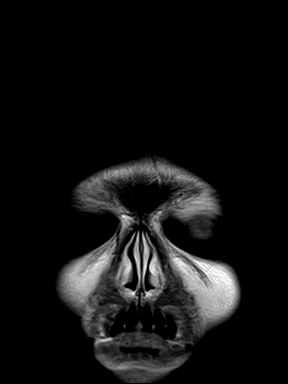
[im 27/27]
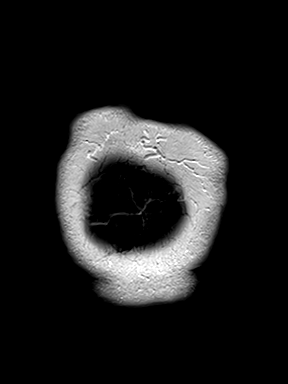

[Series 24: T1 post-contrast · sagittal · 5.0mm · 0.62mm/px · 1 of 23 slices shown (3 of 3)]
[im 1/23]
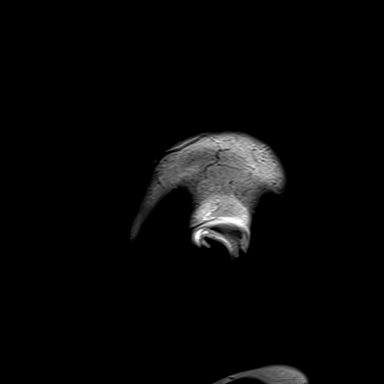

[48 of 48 positions shown; findings below may reference images not displayed]

FINDINGS: Brain: No acute infarction, hemorrhage, hydrocephalus, extra-axial
collection or mass lesion. Rare foci of T2 hyperintensity within the
white matter of the cerebral hemispheres, nonspecific, unchanged
from prior MRI. No focus of abnormal contrast enhancement.

Vascular: Normal flow voids.

Skull and upper cervical spine: Normal marrow signal.

Sinuses/Orbits: Mucosal thickening of the bilateral maxillary
sinuses, right greater than left. The orbits are maintained.
IMPRESSION: 1. No evidence of intracranial metastatic disease.
2. No acute intracranial abnormality.
3. Rare foci of T2 hyperintensity within the white matter of the
cerebral hemispheres, nonspecific, but unchanged from prior MRI.
This may be related to migraine headaches, early chronic
microangiopathic changes and other post inflammatory/infectious
processes.

## 2020-08-29 MED ORDER — GADOBUTROL 1 MMOL/ML IV SOLN
10.0000 mL | Freq: Once | INTRAVENOUS | Status: AC | PRN
  Administered 2020-08-29: 10 mL via INTRAVENOUS

## 2022-02-25 ENCOUNTER — Emergency Department
Admission: EM | Admit: 2022-02-25 | Discharge: 2022-02-25 | Disposition: A | Discharge status: Home / Self Care | Payer: Medicare Other | Attending: Emergency Medicine | Admitting: Emergency Medicine

## 2022-02-25 ENCOUNTER — Emergency Department: Payer: Medicare Other

## 2022-02-25 ENCOUNTER — Other Ambulatory Visit: Payer: Self-pay

## 2022-02-25 ENCOUNTER — Encounter: Payer: Self-pay | Admitting: Emergency Medicine

## 2022-02-25 ENCOUNTER — Encounter: Payer: Self-pay | Admitting: Internal Medicine

## 2022-02-25 DIAGNOSIS — I8391 Asymptomatic varicose veins of right lower extremity: Secondary | ICD-10-CM | POA: Insufficient documentation

## 2022-02-25 DIAGNOSIS — Z859 Personal history of malignant neoplasm, unspecified: Secondary | ICD-10-CM | POA: Diagnosis not present

## 2022-02-25 DIAGNOSIS — I83811 Varicose veins of right lower extremities with pain: Secondary | ICD-10-CM

## 2022-02-25 DIAGNOSIS — M79661 Pain in right lower leg: Secondary | ICD-10-CM

## 2022-02-25 DIAGNOSIS — M79604 Pain in right leg: Secondary | ICD-10-CM | POA: Diagnosis present

## 2022-02-26 ENCOUNTER — Encounter
Admission: RE | Disposition: A | Discharge status: Home / Self Care | Payer: Self-pay | Source: Home / Self Care | Attending: Internal Medicine

## 2022-02-26 ENCOUNTER — Ambulatory Visit
Admission: RE | Admit: 2022-02-26 | Discharge: 2022-02-26 | Disposition: A | Discharge status: Home / Self Care | Payer: Medicare Other | Attending: Internal Medicine | Admitting: Internal Medicine

## 2022-02-26 ENCOUNTER — Encounter: Payer: Self-pay | Admitting: Internal Medicine

## 2022-02-26 ENCOUNTER — Ambulatory Visit: Payer: Medicare Other | Admitting: Certified Registered"

## 2022-02-26 DIAGNOSIS — Z87891 Personal history of nicotine dependence: Secondary | ICD-10-CM | POA: Diagnosis not present

## 2022-02-26 DIAGNOSIS — K5730 Diverticulosis of large intestine without perforation or abscess without bleeding: Secondary | ICD-10-CM | POA: Insufficient documentation

## 2022-02-26 DIAGNOSIS — Z853 Personal history of malignant neoplasm of breast: Secondary | ICD-10-CM | POA: Diagnosis not present

## 2022-02-26 DIAGNOSIS — K640 First degree hemorrhoids: Secondary | ICD-10-CM | POA: Insufficient documentation

## 2022-02-26 DIAGNOSIS — Z85830 Personal history of malignant neoplasm of bone: Secondary | ICD-10-CM | POA: Insufficient documentation

## 2022-02-26 DIAGNOSIS — Z86718 Personal history of other venous thrombosis and embolism: Secondary | ICD-10-CM | POA: Insufficient documentation

## 2022-02-26 DIAGNOSIS — I10 Essential (primary) hypertension: Secondary | ICD-10-CM | POA: Insufficient documentation

## 2022-02-26 DIAGNOSIS — Z1211 Encounter for screening for malignant neoplasm of colon: Secondary | ICD-10-CM | POA: Diagnosis present

## 2022-02-26 HISTORY — DX: Unspecified asthma, uncomplicated: J45.909

## 2022-02-26 HISTORY — PX: COLONOSCOPY: SHX5424

## 2022-02-26 HISTORY — DX: Unspecified osteoarthritis, unspecified site: M19.90

## 2022-02-26 SURGERY — COLONOSCOPY
Anesthesia: General

## 2022-02-26 MED ORDER — LIDOCAINE 2% (20 MG/ML) 5 ML SYRINGE
INTRAMUSCULAR | Status: DC | PRN
  Administered 2022-02-26: 20 mg via INTRAVENOUS

## 2022-02-26 MED ORDER — HEPARIN SOD (PORK) LOCK FLUSH 100 UNIT/ML IV SOLN
INTRAVENOUS | Status: AC
  Filled 2022-02-26: qty 5

## 2022-02-26 MED ORDER — SODIUM CHLORIDE 0.9 % IV SOLN: INTRAVENOUS | Status: DC

## 2022-02-26 MED ORDER — PROPOFOL 10 MG/ML IV BOLUS
INTRAVENOUS | Status: DC | PRN
  Administered 2022-02-26: 70 mg via INTRAVENOUS

## 2022-02-26 MED ORDER — PROPOFOL 500 MG/50ML IV EMUL
INTRAVENOUS | Status: DC | PRN
  Administered 2022-02-26: 120 ug/kg/min via INTRAVENOUS

## 2022-02-26 NOTE — Op Note (Signed)
[facility] Gastroenterology Patient Name: Maria Monroe Procedure Date: 02/26/2022 2:04 PM MRN: [id number] Account #: [id number] Date of Birth: 09/06/1970 Admit Type: Outpatient Age: 51 Room: [facility] ENDO ROOM 2 Gender: Female Note Status: Finalized Instrument Name: Jasper Riling [id number] Procedure:             Colonoscopy Indications:           Screening for colorectal malignant neoplasm Providers:             Lorie Apley K. Alice Reichert MD, MD Referring MD:          Gladstone Lighter, MD (Referring MD) Medicines:             Propofol per Anesthesia Complications:         No immediate complications. Procedure:             Pre-Anesthesia Assessment:                        - The risks and benefits of the procedure and the                         sedation options and risks were discussed with the                         patient. All questions were answered and informed                         consent was obtained.                        - Patient identification and proposed procedure were                         verified prior to the procedure by the nurse. The                         procedure was verified in the procedure room.                        - ASA Grade Assessment: III - A patient with severe                         systemic disease.                        - After reviewing the risks and benefits, the patient                         was deemed in satisfactory condition to undergo the                         procedure.                        After obtaining informed consent, the colonoscope was                         passed under direct vision. Throughout the procedure,                         the  patient's blood pressure, pulse, and oxygen                         saturations were monitored continuously. The                         Colonoscope was introduced through the anus and                         advanced to the the cecum, identified by appendiceal                          orifice and ileocecal valve. The colonoscopy was                         performed without difficulty. The patient tolerated                         the procedure well. The quality of the bowel                         preparation was adequate. The ileocecal valve,                         appendiceal orifice, and rectum were photographed. Findings:      The perianal and digital rectal examinations were normal. Pertinent       negatives include normal sphincter tone and no palpable rectal lesions.      Non-bleeding internal hemorrhoids were found during retroflexion. The       hemorrhoids were Grade I (internal hemorrhoids that do not prolapse).      Multiple small and large-mouthed diverticula were found in the entire       colon. There was no evidence of diverticular bleeding.      The exam was otherwise without abnormality. Impression:            - Non-bleeding internal hemorrhoids.                        - Mild diverticulosis in the entire examined colon.                         There was no evidence of diverticular bleeding.                        - The examination was otherwise normal.                        - No specimens collected. Recommendation:        - Patient has a contact number available for                         emergencies. The signs and symptoms of potential                         delayed complications were discussed with the patient.                         Return to normal activities tomorrow. Written  discharge instructions were provided to the patient.                        - Resume previous diet.                        - Continue present medications.                        - Repeat colonoscopy in 10 years for screening                         purposes.                        - Return to GI office PRN.                        - The findings and recommendations were discussed with                         the patient. Procedure  Code(s):     --- Professional ---                        C3762, Colorectal cancer screening; colonoscopy on                         individual not meeting criteria for high risk Diagnosis Code(s):     --- Professional ---                        K57.30, Diverticulosis of large intestine without                         perforation or abscess without bleeding                        K64.0, First degree hemorrhoids                        Z12.11, Encounter for screening for malignant neoplasm                         of colon CPT copyright 2019 American [employer] Association. All rights reserved. The codes documented in this report are preliminary and upon coder review may  be revised to meet current compliance requirements. Efrain Sella MD, MD 02/26/2022 2:27:14 PM This report has been signed electronically. Number of Addenda: 0 Note Initiated On: 02/26/2022 2:04 PM Scope Withdrawal Time: 0 hours 6 minutes 32 seconds  Total Procedure Duration: 0 hours 10 minutes 6 seconds  Estimated Blood Loss:  Estimated blood loss: none.      [facility]

## 2022-02-26 NOTE — Anesthesia Preprocedure Evaluation (Signed)
Anesthesia Evaluation  Patient identified by MRN, date of birth, ID band Patient awake    Reviewed: Allergy & Precautions, H&P , NPO status , Patient's Chart, lab work & pertinent test results, reviewed documented beta blocker date and time   History of Anesthesia Complications Negative for: history of anesthetic complications  Airway Mallampati: II  TM Distance: >3 FB Neck ROM: full    Dental  (+) Dental Advidsory Given, Teeth Intact   Pulmonary neg shortness of breath, asthma , neg sleep apnea, neg COPD, neg recent URI, former smoker,    Pulmonary exam normal breath sounds clear to auscultation       Cardiovascular Exercise Tolerance: Good hypertension, (-) angina(-) Past MI and (-) Cardiac Stents Normal cardiovascular exam(-) dysrhythmias (-) Valvular Problems/Murmurs Rhythm:regular Rate:Normal     Neuro/Psych PSYCHIATRIC DISORDERS Anxiety negative neurological ROS     GI/Hepatic negative GI ROS, Neg liver ROS,   Endo/Other  negative endocrine ROS  Renal/GU negative Renal ROS  negative genitourinary   Musculoskeletal   Abdominal   Peds  Hematology negative hematology ROS (+)   Anesthesia Other Findings Past Medical History: No date: Arthritis No date: Asthma No date: Bone metastasis No date: Cancer ([place])     Comment:  overlapping sites of right breast No date: DVT (deep venous thrombosis) (HCC) No date: H/O bilateral mastectomy No date: HER2-positive carcinoma of right breast (HCC) No date: Hypertension No date: Lymphedema of both arms   Reproductive/Obstetrics negative OB ROS                             Anesthesia Physical Anesthesia Plan  ASA: 2  Anesthesia Plan: General   Post-op Pain Management:    Induction: Intravenous  PONV Risk Score and Plan: Propofol infusion and TIVA  Airway Management Planned: Natural Airway and Nasal Cannula  Additional Equipment:    Intra-op Plan:   Post-operative Plan:   Informed Consent: I have reviewed the patients History and Physical, chart, labs and discussed the procedure including the risks, benefits and alternatives for the proposed anesthesia with the patient or authorized representative who has indicated his/her understanding and acceptance.     Dental Advisory Given  Plan Discussed with: Anesthesiologist, CRNA and Surgeon  Anesthesia Plan Comments:         Anesthesia Quick Evaluation

## 2022-02-26 NOTE — Interval H&P Note (Signed)
History and Physical Interval Note:  02/26/2022 12:48 PM  Maria Monroe  has presented today for surgery, with the diagnosis of Colon cancer screening (Z12.11).  The various methods of treatment have been discussed with the patient and family. After consideration of risks, benefits and other options for treatment, the patient has consented to  Procedure(s): COLONOSCOPY (N/A) as a surgical intervention.  The patient's history has been reviewed, patient examined, no change in status, stable for surgery.  I have reviewed the patient's chart and labs.  Questions were answered to the patient's satisfaction.     [place], [place]

## 2022-02-26 NOTE — H&P (Signed)
Outpatient short stay form Pre-procedure 02/26/2022 12:47 PM Maria Monroe K. Alice Reichert, M.D.  Primary Physician: Gladstone Lighter, M.D.  Reason for visit:  Colon cancer screening  History of present illness:  51 y/o female with personal history of breast CA presents for colon cancer screening. Patient denies change in bowel habits, rectal bleeding, weight loss or abdominal pain.     No current facility-administered medications for this encounter.  Medications Prior to Admission  Medication Sig Dispense Refill Last Dose   diphenhydrAMINE (BENADRYL) 25 mg capsule Take 25 mg by mouth every 6 (six) hours as needed for itching.      loratadine (CLARITIN) 10 MG tablet Take 10 mg by mouth daily.      polyethylene glycol (MIRALAX / GLYCOLAX) 17 g packet Take 17 g by mouth daily as needed for mild constipation.      prochlorperazine (COMPAZINE) 10 MG tablet Take 10 mg by mouth every 6 (six) hours as needed for nausea or vomiting.      sacubitril-valsartan (ENTRESTO) 24-26 MG Take 1 tablet by mouth 2 (two) times daily.      sennosides-docusate sodium (SENOKOT-S) 8.6-50 MG tablet Take 1 tablet by mouth daily.      spironolactone (ALDACTONE) 25 MG tablet Take 25 mg by mouth daily.      carvedilol (COREG) 12.5 MG tablet Take 12.5 mg by mouth 2 (two) times daily.      carvedilol (COREG) 25 MG tablet Take 25 mg by mouth 2 (two) times daily.      cholecalciferol (VITAMIN D3) 25 MCG (1000 UNIT) tablet Take 1,000 Units by mouth daily.      levothyroxine (SYNTHROID) 50 MCG tablet Take 50 mcg by mouth every morning.      losartan (COZAAR) 50 MG tablet Take 1 tablet (50 mg total) by mouth daily. 30 tablet 0    ondansetron (ZOFRAN) 4 MG tablet Take 1 tablet (4 mg total) by mouth every 8 (eight) hours as needed for nausea or vomiting. 12 tablet 0    oxyCODONE (OXY IR/ROXICODONE) 5 MG immediate release tablet Take 5 mg by mouth every 4 (four) hours as needed (pain).       PRESCRIPTION MEDICATION Chemo infusion  (cancer treatment) every 3 weeks at [place]  Allergen Reactions   Aspirin Hives   Tomato Hives, Itching and Swelling   Citric Acid Hives   Nabumetone Other (See Comments)    Throat itching     Past Medical History:  Diagnosis Date   Arthritis    Asthma    Bone metastasis    Cancer ([place])    overlapping sites of right breast   DVT (deep venous thrombosis) (HCC)    H/O bilateral mastectomy    HER2-positive carcinoma of right breast ([place])    Hypertension    Lymphedema of both arms     Review of systems:  Otherwise negative.    Physical Exam  Gen: Alert, oriented. Appears stated age.  HEENT: [place]/AT. PERRLA. Lungs: CTA, no wheezes. CV: RR nl S1, S2. Abd: soft, benign, no masses. BS+ Ext: No edema. Pulses 2+    Planned procedures: Proceed with colonoscopy. The patient understands the nature of the planned procedure, indications, risks, alternatives and potential complications including but not limited to bleeding, infection, perforation, damage to internal organs and possible oversedation/side effects from anesthesia. The patient agrees and gives consent to proceed.  Please refer to procedure notes for findings, recommendations and patient disposition/instructions.  [name] K. Alice Reichert, M.D. Gastroenterology 02/26/2022  12:47 PM

## 2022-02-26 NOTE — Transfer of Care (Signed)
Immediate Anesthesia Transfer of Care Note  Patient: Maria Monroe  Procedure(s) Performed: COLONOSCOPY  Patient Location: Endoscopy Unit  Anesthesia Type:General  Level of Consciousness: awake and oriented  Airway & Oxygen Therapy: Patient Spontanous Breathing  Post-op Assessment: Report given to RN and Post -op Vital signs reviewed and stable  Post vital signs: Reviewed  Last Vitals:  Vitals Value Taken Time  BP    Temp    Pulse    Resp    SpO2      Last Pain:         Complications: No notable events documented.

## 2022-02-27 ENCOUNTER — Encounter: Payer: Self-pay | Admitting: Internal Medicine

## 2022-03-04 NOTE — Anesthesia Postprocedure Evaluation (Addendum)
Anesthesia Post Note  Patient: Sevyn Markham  Procedure(s) Performed: COLONOSCOPY  Patient location during evaluation: Endoscopy Anesthesia Type: General Level of consciousness: awake and alert Pain management: pain level controlled Vital Signs Assessment: post-procedure vital signs reviewed and stable Respiratory status: spontaneous breathing, nonlabored ventilation, respiratory function stable and patient connected to nasal cannula oxygen Cardiovascular status: blood pressure returned to baseline and stable Postop Assessment: no apparent nausea or vomiting Anesthetic complications: no   No notable events documented.   Last Vitals:  Vitals:   02/26/22 1441 02/26/22 1451  BP: (!) 125/92 115/76  Pulse: 76 78  Resp: 20 16  Temp:    SpO2: 100% 100%    Last Pain:  Vitals:   02/26/22 1451  TempSrc:   PainSc: 0-No pain                 Martha Clan

## 2022-04-13 ENCOUNTER — Other Ambulatory Visit: Payer: Self-pay

## 2022-04-13 ENCOUNTER — Emergency Department (HOSPITAL_COMMUNITY): Payer: Medicare Other

## 2022-04-13 ENCOUNTER — Observation Stay (HOSPITAL_COMMUNITY)
Admission: EM | Admit: 2022-04-13 | Discharge: 2022-04-14 | Disposition: A | Discharge status: Home / Self Care | Payer: Medicare Other | Attending: Family Medicine | Admitting: Family Medicine

## 2022-04-13 ENCOUNTER — Encounter (HOSPITAL_COMMUNITY): Payer: Self-pay | Admitting: Emergency Medicine

## 2022-04-13 DIAGNOSIS — I110 Hypertensive heart disease with heart failure: Secondary | ICD-10-CM | POA: Diagnosis not present

## 2022-04-13 DIAGNOSIS — Z79899 Other long term (current) drug therapy: Secondary | ICD-10-CM | POA: Diagnosis not present

## 2022-04-13 DIAGNOSIS — C50911 Malignant neoplasm of unspecified site of right female breast: Secondary | ICD-10-CM | POA: Insufficient documentation

## 2022-04-13 DIAGNOSIS — R112 Nausea with vomiting, unspecified: Secondary | ICD-10-CM

## 2022-04-13 DIAGNOSIS — I5020 Unspecified systolic (congestive) heart failure: Secondary | ICD-10-CM | POA: Insufficient documentation

## 2022-04-13 DIAGNOSIS — Z86718 Personal history of other venous thrombosis and embolism: Secondary | ICD-10-CM | POA: Diagnosis not present

## 2022-04-13 DIAGNOSIS — J45909 Unspecified asthma, uncomplicated: Secondary | ICD-10-CM | POA: Insufficient documentation

## 2022-04-13 DIAGNOSIS — Z87891 Personal history of nicotine dependence: Secondary | ICD-10-CM | POA: Diagnosis not present

## 2022-04-13 DIAGNOSIS — Z9013 Acquired absence of bilateral breasts and nipples: Secondary | ICD-10-CM | POA: Diagnosis not present

## 2022-04-13 DIAGNOSIS — Z170 Estrogen receptor positive status [ER+]: Secondary | ICD-10-CM | POA: Insufficient documentation

## 2022-04-13 DIAGNOSIS — R55 Syncope and collapse: Principal | ICD-10-CM | POA: Insufficient documentation

## 2022-04-13 DIAGNOSIS — R001 Bradycardia, unspecified: Secondary | ICD-10-CM | POA: Insufficient documentation

## 2022-04-13 DIAGNOSIS — C7951 Secondary malignant neoplasm of bone: Secondary | ICD-10-CM | POA: Insufficient documentation

## 2022-04-13 LAB — COMPREHENSIVE METABOLIC PANEL
ALT: 40 U/L (ref 0–44)
AST: 92 U/L — ABNORMAL HIGH (ref 15–41)
Albumin: 3.9 g/dL (ref 3.5–5.0)
Alkaline Phosphatase: 90 U/L (ref 38–126)
Anion gap: 9 (ref 5–15)
BUN: 5 mg/dL — ABNORMAL LOW (ref 6–20)
CO2: 27 mmol/L (ref 22–32)
Calcium: 10.1 mg/dL (ref 8.9–10.3)
Chloride: 102 mmol/L (ref 98–111)
Creatinine, Ser: 1 mg/dL (ref 0.44–1.00)
GFR, Estimated: >60 mL/min (ref >60)
Glucose, Bld: 100 mg/dL — ABNORMAL HIGH (ref 70–99)
Potassium: 3.9 mmol/L (ref 3.5–5.1)
Sodium: 138 mmol/L (ref 135–145)
Total Bilirubin: 0.5 mg/dL (ref 0.3–1.2)
Total Protein: 9.3 g/dL — ABNORMAL HIGH (ref 6.5–8.1)

## 2022-04-13 LAB — CBC
HCT: 43.3 % (ref 36.0–46.0)
Hemoglobin: 14.3 g/dL (ref 12.0–15.0)
MCH: 29.1 pg (ref 26.0–34.0)
MCHC: 33 g/dL (ref 30.0–36.0)
MCV: 88.2 fL (ref 80.0–100.0)
Platelets: 190 10*3/uL (ref 150–400)
RBC: 4.91 MIL/uL (ref 3.87–5.11)
RDW: 14.9 % (ref 11.5–15.5)
WBC: 8.8 10*3/uL (ref 4.0–10.5)
nRBC: 0 % (ref 0.0–0.2)

## 2022-04-13 LAB — I-STAT CHEM 8, ED
BUN: 4 mg/dL — ABNORMAL LOW (ref 6–20)
Calcium, Ion: 1.18 mmol/L (ref 1.15–1.40)
Chloride: 101 mmol/L (ref 98–111)
Creatinine, Ser: 0.9 mg/dL (ref 0.44–1.00)
Glucose, Bld: 97 mg/dL (ref 70–99)
HCT: 45 % (ref 36.0–46.0)
Hemoglobin: 15.3 g/dL — ABNORMAL HIGH (ref 12.0–15.0)
Potassium: 3.9 mmol/L (ref 3.5–5.1)
Sodium: 140 mmol/L (ref 135–145)
TCO2: 29 mmol/L (ref 22–32)

## 2022-04-13 LAB — TROPONIN I (HIGH SENSITIVITY)
Troponin I (High Sensitivity): 10 ng/L (ref <18)
Troponin I (High Sensitivity): 11 ng/L (ref <18)

## 2022-04-13 LAB — CBG MONITORING, ED
Glucose-Capillary: 90 mg/dL (ref 70–99)
Glucose-Capillary: 128 mg/dL — ABNORMAL HIGH (ref 70–99)

## 2022-04-13 LAB — LIPASE, BLOOD: Lipase: 34 U/L (ref 11–51)

## 2022-04-13 MED ORDER — ONDANSETRON HCL 4 MG/2ML IJ SOLN
4.0000 mg | Freq: Once | INTRAMUSCULAR | Status: AC
  Administered 2022-04-13: 4 mg via INTRAVENOUS
  Filled 2022-04-13: qty 2

## 2022-04-13 MED ORDER — ONDANSETRON HCL 4 MG/2ML IJ SOLN: 4.0000 mg | Freq: Once | INTRAMUSCULAR | Status: DC

## 2022-04-13 MED ORDER — ONDANSETRON 4 MG PO TBDP: 4.0000 mg | ORAL_TABLET | Freq: Once | ORAL | Status: DC

## 2022-04-13 MED ORDER — IOHEXOL 350 MG/ML SOLN
100.0000 mL | Freq: Once | INTRAVENOUS | Status: AC | PRN
  Administered 2022-04-13: 100 mL via INTRAVENOUS

## 2022-04-13 MED ORDER — ONDANSETRON 4 MG PO TBDP
4.0000 mg | ORAL_TABLET | Freq: Once | ORAL | Status: AC
  Administered 2022-04-13: 4 mg via ORAL
  Filled 2022-04-13: qty 1

## 2022-04-13 MED ORDER — LACTATED RINGERS IV BOLUS
1000.0000 mL | Freq: Once | INTRAVENOUS | Status: AC
  Administered 2022-04-13: 1000 mL via INTRAVENOUS

## 2022-04-13 MED ORDER — SODIUM CHLORIDE 0.9 % IV SOLN: INTRAVENOUS | Status: DC

## 2022-04-13 MED ORDER — SODIUM CHLORIDE 0.9 % IV SOLN
12.5000 mg | Freq: Once | INTRAVENOUS | Status: AC
  Administered 2022-04-13: 12.5 mg via INTRAVENOUS
  Filled 2022-04-13: qty 0.5

## 2022-04-13 NOTE — ED Provider Triage Note (Signed)
Emergency Medicine Provider Triage Evaluation Note  Maria Monroe , a 51 y.o. female  was evaluated in triage.  Pt complains of syncopal episode before arriving to the ED today.  Also with 1 episode of vomiting.  Currently receiving cancer treatment for breast cancer, most recent chemo 2 weeks ago.  When patient was returned for evaluation, patient had been found in the bathroom with vomit and crying, stated she had passed out again.  While interviewing the patient, appears presyncopal twice.  Denies chest pain, shortness of breath, diarrhea, stomach pain, headache, or neck stiffness.  No recent fevers.  Review of Systems  Positive:  Negative: See above  Physical Exam  BP (!) 155/117   Pulse 83   Temp 98.1 F (36.7 C) (Oral)   Resp 18   SpO2 98%  Gen:   Oriented x3.  Ill-appearing. Resp:  Normal effort  MSK:   Moves extremities without difficulty  Other:  Abdomen soft nontender.  With vomit on her clothes.  Actively vomiting several times during encounter.  Medical Decision Making  Medically screening exam initiated at 7:50 PM.  Appropriate orders placed.  Maria Monroe was informed that the remainder of the evaluation will be completed by another provider, this initial triage assessment does not replace that evaluation, and the importance of remaining in the ED until their evaluation is complete.  Discussed patient with triage nurse, needs to be on the monitor, will be brought to the next available room.   Prince Rome, PA-C 07/68/08 2004

## 2022-04-13 NOTE — ED Notes (Signed)
CT scan called to get pt in 20mns after phenegran

## 2022-04-13 NOTE — ED Triage Notes (Signed)
Patient complains a loss of consciousness that occurred around noon today after vomiting. Patient also reports indigestion that started after a "55 minute walk" last night.

## 2022-04-13 NOTE — ED Notes (Signed)
RN was asked by provider to access pt's port.  Pt indicated that she has a power port and a "purple card."  Provider indicated that RN was to access port.  Sterile field was maintained during process and pt handled it very well.

## 2022-04-13 NOTE — ED Provider Notes (Signed)
[facility] EMERGENCY DEPARTMENT Provider Note   CSN: [id number] Arrival date & time: 04/13/22  1536   History  Chief Complaint  Patient presents with   Loss of Consciousness   Maria Monroe is a 51 y.o. female PMH metastatic breast cancer, DVT, obesity, hypertension who is presenting with emesis and syncope.  Patient's son is at bedside, contributes to the history.  They report that last night, the patient ate pizza for dinner, and had indigestion.  This morning, she continued to have indigestion, followed by an episode of vomiting.  Immediately after vomiting, patient syncopized.  Patient denies any dizziness, lightheadedness, warmth, "warning signs" prior to syncopal episode.  She has not been eating or drinking much today due to the indigestion, which she further describes as a "burning sensation" in her throat.  She denies any chest pain, difficulty breathing, abdominal pain, dysuria, flank pain, peripheral edema.  Patient reports that this is happened in the past, usually when her cancer "comes back".  Patient is currently undergoing chemotherapy for stage IV breast cancer.  While in the waiting room bathroom, patient experienced another episode of emesis and subsequently syncopized.  Home Medications Prior to Admission medications   Medication Sig Start Date End Date Taking? Authorizing Provider  carvedilol (COREG) 12.5 MG tablet Take 12.5 mg by mouth 2 (two) times daily. 05/18/20   [provider]  carvedilol (COREG) 25 MG tablet Take 25 mg by mouth 2 (two) times daily. 12/23/21   [provider]  cholecalciferol (VITAMIN D3) 25 MCG (1000 UNIT) tablet Take 1,000 Units by mouth daily.    [provider]  diphenhydrAMINE (BENADRYL) 25 mg capsule Take 25 mg by mouth every 6 (six) hours as needed for itching.    [provider]  levothyroxine (SYNTHROID) 50 MCG tablet Take 50 mcg by mouth every morning. 01/05/22   [provider]  loratadine (CLARITIN) 10 MG tablet Take 10 mg by mouth daily.    [provider]  losartan (COZAAR) 50 MG tablet Take 1 tablet (50 mg total) by mouth daily. 06/11/20   Little Ishikawa, MD  ondansetron (ZOFRAN) 4 MG tablet Take 1 tablet (4 mg total) by mouth every 8 (eight) hours as needed for nausea or vomiting. 06/08/20   Long, Wonda Olds, MD  oxyCODONE (OXY IR/ROXICODONE) 5 MG immediate release tablet Take 5 mg by mouth every 4 (four) hours as needed (pain).  06/01/20   [provider]  polyethylene glycol (MIRALAX / GLYCOLAX) 17 g packet Take 17 g by mouth daily as needed for mild constipation.    [provider]  PRESCRIPTION MEDICATION Chemo infusion (cancer treatment) every 3 weeks at [facility]    [provider]  prochlorperazine (COMPAZINE) 10 MG tablet Take 10 mg by mouth every 6 (six) hours as needed for nausea or vomiting.    [provider]  sacubitril-valsartan (ENTRESTO) 24-26 MG Take 1 tablet by mouth 2 (two) times daily.    [provider]  sennosides-docusate sodium (SENOKOT-S) 8.6-50 MG tablet Take 1 tablet by mouth daily.    [provider]  spironolactone (ALDACTONE) 25 MG tablet Take 25 mg by mouth daily.    [provider]      Allergies    Aspirin, Tomato, Citric acid, and Nabumetone    Review of Systems   Review of Systems  Cardiovascular:  Positive for syncope.    Physical Exam Updated Vital Signs BP (!) 155/117   Pulse 83  Temp 98.1 F (36.7 C) (Oral)   Resp 18   SpO2 98%  Physical Exam Vitals and nursing note reviewed.  Constitutional:      General: She is not in acute distress.    Appearance: Normal appearance. She is well-developed. She is not ill-appearing or diaphoretic.     Comments: Tired appearing female.  Exam interrupted due to active vomiting and dry heaving.  HENT:     Head: Normocephalic and atraumatic.     Right Ear: External ear normal.     Left  Ear: External ear normal.     Nose: Nose normal.     Mouth/Throat:     Mouth: Mucous membranes are moist.  Cardiovascular:     Rate and Rhythm: Normal rate and regular rhythm.     Heart sounds: No murmur heard. Pulmonary:     Effort: Pulmonary effort is normal. No respiratory distress.     Breath sounds: Normal breath sounds. No wheezing.  Abdominal:     General: There is no distension.     Palpations: Abdomen is soft.     Tenderness: There is no abdominal tenderness. There is no guarding.  Musculoskeletal:     Cervical back: Neck supple.     Right lower leg: No edema.     Left lower leg: No edema.  Skin:    Comments: Patient is cold, clammy.  Neurological:     Mental Status: She is alert.    ED Results / Procedures / Treatments   Labs (all labs ordered are listed, but only abnormal results are displayed) Labs Reviewed  I-STAT CHEM 8, ED - Abnormal; Notable for the following components:      Result Value   BUN 4 (*)    Hemoglobin 15.3 (*)    All other components within normal limits  CBC  URINALYSIS, ROUTINE W REFLEX MICROSCOPIC  COMPREHENSIVE METABOLIC PANEL  LIPASE, BLOOD  CBG MONITORING, ED  TROPONIN I (HIGH SENSITIVITY)    EKG None  Radiology No results found.  Procedures Procedures   Medications Ordered in ED Medications  ondansetron (ZOFRAN-ODT) disintegrating tablet 4 mg (has no administration in time range)    ED Course/ Medical Decision Making/ A&P                           Medical Decision Making Amount and/or Complexity of Data Reviewed Labs: ordered. Radiology: ordered.  Risk Prescription drug management.   Maria Monroe is a 51 y.o. female with significant PMHx breast cancer, DVT who presented to the ED with syncopal episode x2 and emesis.  Patient is hemodynamically stable, afebrile, satting well on room air.  Physical exam reassuring.  Normal heart sounds.  Lungs clear to auscultation bilaterally.  Abdomen is soft, nontender to  palpation.  No pitting edema.  Initial differential includes but is not limited to: cardiac cause of syncope (arrhythmia, ACS, N/STEMI), pulmonary embolism, dehydration, adverse medication reaction, gastroenteritis   Lab work obtained, resulted notable for CBC without leukocytosis or anemia.  Fingerstick within normal limits.  Lipase normal.  Initial troponin 10, repeat 11.  Patient considered high risk for PE given syncopal episode, active cancer, and past medical history DVT.  CTA PE study was pursued, with preliminary read pending at the time of signout, though on my independent interpretation, I do not see any large pulmonary embolism.  EKG obtained, demonstrates sinus rhythm, ventricular rate 90 bpm.  No evidence of abnormal intervals.  No T wave  inversions in precordial leads.  Interpreted by myself and my attending.  While in the ED, patient experienced significant episode of bradycardia with heart rate in the 20s during emesis, and became slumped, but did not completely syncopized.  This episode resolved without additional intervention.  This episode was captured on telemetry.  I consulted cardiology regarding these findings.  They believe that this may be due to strong vagal nerve stimulation in the setting of active vomiting and do not feel that the patient needs additional cardiac work-up tonight. Still, I feel the patient would benefit from inpatient admission for IV rehydration, and hemodynamic monitoring including maintaining patient on telemetry overnight given frequency of syncopal episodes in the setting of recurrent emesis of unknown cause.  Interventions include IV LR bolus, IV Zofran  Patient will likely be admitted for IV hydration, syncope and emesis work-up, though will defer to oncoming team assuming care.  The plan for this patient was discussed with Dr. Sabra Heck, who voiced agreement and who oversaw evaluation and treatment of this patient.   Final Clinical Impression(s) /  ED Diagnoses Final diagnoses:  Syncope and collapse  Nausea and vomiting, unspecified vomiting type    Rx / DC Orders ED Discharge Orders     None         Faylene Million, MD 04/14/22 Dyann Kief    Noemi Chapel, MD 04/16/22 1438

## 2022-04-14 ENCOUNTER — Encounter (HOSPITAL_COMMUNITY): Payer: Self-pay | Admitting: Student

## 2022-04-14 DIAGNOSIS — R55 Syncope and collapse: Secondary | ICD-10-CM

## 2022-04-14 DIAGNOSIS — I5032 Chronic diastolic (congestive) heart failure: Secondary | ICD-10-CM | POA: Diagnosis not present

## 2022-04-14 LAB — COMPREHENSIVE METABOLIC PANEL
ALT: 38 U/L (ref 0–44)
AST: 79 U/L — ABNORMAL HIGH (ref 15–41)
Albumin: 3.2 g/dL — ABNORMAL LOW (ref 3.5–5.0)
Alkaline Phosphatase: 78 U/L (ref 38–126)
Anion gap: 8 (ref 5–15)
BUN: <5 mg/dL — ABNORMAL LOW (ref 6–20)
CO2: 27 mmol/L (ref 22–32)
Calcium: 9.3 mg/dL (ref 8.9–10.3)
Chloride: 103 mmol/L (ref 98–111)
Creatinine, Ser: 0.88 mg/dL (ref 0.44–1.00)
GFR, Estimated: >60 mL/min (ref >60)
Glucose, Bld: 134 mg/dL — ABNORMAL HIGH (ref 70–99)
Potassium: 3.3 mmol/L — ABNORMAL LOW (ref 3.5–5.1)
Sodium: 138 mmol/L (ref 135–145)
Total Bilirubin: 0.5 mg/dL (ref 0.3–1.2)
Total Protein: 7.8 g/dL (ref 6.5–8.1)

## 2022-04-14 LAB — URINALYSIS, ROUTINE W REFLEX MICROSCOPIC
Bilirubin Urine: NEGATIVE
Glucose, UA: NEGATIVE mg/dL
Hgb urine dipstick: NEGATIVE
Ketones, ur: NEGATIVE mg/dL
Leukocytes,Ua: NEGATIVE
Nitrite: NEGATIVE
Protein, ur: NEGATIVE mg/dL
Specific Gravity, Urine: 1.01 (ref 1.005–1.030)
pH: 7 (ref 5.0–8.0)

## 2022-04-14 LAB — CBG MONITORING, ED: Glucose-Capillary: 196 mg/dL — ABNORMAL HIGH (ref 70–99)

## 2022-04-14 LAB — HIV ANTIBODY (ROUTINE TESTING W REFLEX): HIV Screen 4th Generation wRfx: NONREACTIVE

## 2022-04-14 MED ORDER — ONDANSETRON HCL 4 MG PO TABS: 4.0000 mg | ORAL_TABLET | Freq: Four times a day (QID) | ORAL | Status: DC | PRN

## 2022-04-14 MED ORDER — POTASSIUM CHLORIDE 20 MEQ PO PACK
40.0000 meq | PACK | Freq: Once | ORAL | Status: AC
  Filled 2022-04-14: qty 2

## 2022-04-14 MED ORDER — ENOXAPARIN SODIUM 40 MG/0.4ML IJ SOSY
40.0000 mg | PREFILLED_SYRINGE | Freq: Every day | INTRAMUSCULAR | Status: DC
  Filled 2022-04-14: qty 0.4

## 2022-04-14 MED ORDER — POTASSIUM CHLORIDE CRYS ER 20 MEQ PO TBCR
40.0000 meq | EXTENDED_RELEASE_TABLET | Freq: Once | ORAL | Status: AC
  Administered 2022-04-14: 40 meq via ORAL
  Filled 2022-04-14: qty 2

## 2022-04-14 MED ORDER — HEPARIN SOD (PORK) LOCK FLUSH 100 UNIT/ML IV SOLN
500.0000 [IU] | Freq: Once | INTRAVENOUS | Status: DC
  Filled 2022-04-14: qty 5

## 2022-04-14 MED ORDER — HEPARIN SOD (PORK) LOCK FLUSH 100 UNIT/ML IV SOLN: 500.0000 [IU] | INTRAVENOUS | Status: DC | PRN

## 2022-04-14 MED ORDER — POTASSIUM CHLORIDE CRYS ER 20 MEQ PO TBCR: 40.0000 meq | EXTENDED_RELEASE_TABLET | Freq: Once | ORAL | Status: DC

## 2022-04-14 MED ORDER — CARVEDILOL 12.5 MG PO TABS
25.0000 mg | ORAL_TABLET | Freq: Two times a day (BID) | ORAL | Status: DC
  Administered 2022-04-14 (×2): 25 mg via ORAL
  Filled 2022-04-14 (×2): qty 2

## 2022-04-14 MED ORDER — ONDANSETRON HCL 4 MG/2ML IJ SOLN: 4.0000 mg | Freq: Four times a day (QID) | INTRAMUSCULAR | Status: DC | PRN

## 2022-04-14 MED ORDER — SACUBITRIL-VALSARTAN 24-26 MG PO TABS
1.0000 | ORAL_TABLET | Freq: Two times a day (BID) | ORAL | Status: DC
  Administered 2022-04-14 (×2): 1 via ORAL
  Filled 2022-04-14 (×2): qty 1

## 2022-04-14 MED ORDER — ONDANSETRON HCL 4 MG PO TABS: 4.0000 mg | ORAL_TABLET | Freq: Four times a day (QID) | ORAL | 2 refills | Status: DC | PRN

## 2022-04-14 MED ORDER — SPIRONOLACTONE 25 MG PO TABS
25.0000 mg | ORAL_TABLET | Freq: Every day | ORAL | Status: DC
  Administered 2022-04-14: 25 mg via ORAL
  Filled 2022-04-14: qty 1

## 2022-04-14 NOTE — Care Management Obs Status (Signed)
[place] NOTIFICATION   Patient Details  Name: Lauryl Seyer MRN: [id number] Date of Birth: December 02, 1970   Medicare Observation Status Notification Given:  Yes    Fuller Mandril, RN 04/14/2022, 4:52 PM

## 2022-04-14 NOTE — ED Provider Notes (Signed)
Admission for nausea, vomiting, recurrent syncope with bradycardia. Discussed with family practice residents  CT negative for large pulmonary embolism.  Does show right-sided airspace disease consistent with either cancer or fibrotic changes.   Ezequiel Essex, MD 04/14/22 [phone]

## 2022-04-14 NOTE — Consult Note (Addendum)
Cardiology Consultation:   Patient ID: Maria Monroe MRN: [id number]; DOB: [birth year]  Admit date: 04/13/2022 Date of Consult: 04/14/2022  PCP:  Gladstone Lighter, MD   [facility] Providers Cardiologist:  None       Followed at [facility] by Dr. Sharlett Iles Patient Profile:   Maria Monroe is a 51 y.o. female with a hx of DVT, cancer Breast and Ovarian, lymphedema of both arms, Sarcoidosis of lung HTN,  who is being seen 04/14/2022 for the evaluation of Bradycardia with vomiting and syncope at the request of Dr. Nori Riis.  History of Present Illness:   Maria Monroe with above hx and followed at [facility] with cardiomyopathy with mild decrease in EF at low normal  on spironolactone, entresto and coreg.  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   She did have radiation therapy as well.  No cardiac cath hx or stress test.  + cardiac MRIs and echos.  She has chronic chest wall pain.    Echo 12/2021 with EF >55% Compared with prior Echo study on 11/26/2021: LVEF NOW NORMAL; GLS DECREASED FROM -16.0 TO -17.3   Pt presented to ER yesterday afternoon with syncope and vomiting.  She had indigestion the night before after eating pizza and continued into yesterday AM.  She passed out while vomiting initially.  Indigestion described as burning sensation in her throat.  While in waiting room she had another episodes of emesis and then syncopized.  She felt motion sickness from moving in wheelchair and vomited and HR noted to be 40s.  She continued with nausea and vomiting an notes with bradycardia.  No prior GI issues with this chemo.      EKG:  The EKG was personally reviewed and demonstrates:  SR at 90 and inverted T waves inf lateral  prior EKGs with non specific T wave abnormalities.  LVH, SR Telemetry:  Telemetry was personally reviewed and demonstrates:  SR no brady seen think most while in waiting room.   Hs troponin 10 and 11 Na 138,  K+ 3.3 BUN <5 Cr 0.88 ASAT 79 ALT 38  Hgb 14.3 plts 190 and WBC  8.8  CTA of Chest:  IMPRESSION: 1. Negative for acute pulmonary embolism. 2. Ill-defined consolidation about the right superior hilum and right upper lobe with volume loss and bronchiolectasis may be related to post radiation fibrosis though without comparison imaging malignancy is difficult to exclude. 3. There is mild narrowing of the right mainstem bronchus and air trapping in the right upper lobe. 4. Cardiomegaly. 5. Sclerotic C7 and T2 vertebral bodies consistent with osseous metastatic disease unchanged from CT 06/08/2020   PCXR  IMPRESSION: 1. Hazy airspace opacity about the right superior hilum may be due to pneumonia. Follow-up is recommended to ensure resolution. 2. Left chest wall Port-A-Cath tip in the right atrium.   No orthostatic VS.   BP 127/97 P 80s R 18 afebrile.  Currently feels well.  She tells me she must keep hydrated.    No chest pain or SOB.  Past Medical History:  Diagnosis Date   Arthritis    Asthma    Bone metastasis    Cancer ([place])    overlapping sites of right breast   DVT (deep venous thrombosis) (HCC)    H/O bilateral mastectomy    HER2-positive carcinoma of right breast ([place])    Hypertension    Lymphedema of both arms     Past Surgical History:  Procedure Laterality Date   ABDOMINAL HYSTERECTOMY  BRONCHOSCOPY     x3   CESAREAN SECTION     x2   COLONOSCOPY N/A 02/26/2022   Procedure: COLONOSCOPY;  Surgeon: Toledo, Benay Pike, MD;  Location: [facility] ENDOSCOPY;  Service: Gastroenterology;  Laterality: N/A;   MASTECTOMY     PORTA CATH INSERTION Left    TUBAL LIGATION       Home Medications:  Prior to Admission medications   Medication Sig Start Date End Date Taking? Authorizing Provider  carvedilol (COREG) 25 MG tablet Take 25 mg by mouth 2 (two) times daily. 12/23/21  Yes [provider]  diphenhydrAMINE (BENADRYL) 25 mg capsule Take 25 mg by mouth every 6 (six) hours as needed for itching.   Yes [provider]   oxyCODONE (OXY IR/ROXICODONE) 5 MG immediate release tablet Take 5 mg by mouth every 4 (four) hours as needed (pain).  06/01/20  Yes [provider]  sacubitril-valsartan (ENTRESTO) 24-26 MG Take 1 tablet by mouth 2 (two) times daily.   Yes [provider]  spironolactone (ALDACTONE) 25 MG tablet Take 25 mg by mouth daily.   Yes [provider]  PRESCRIPTION MEDICATION Chemo infusion (cancer treatment) every 3 weeks at [facility]    [provider]    Inpatient Medications: Scheduled Meds:  carvedilol  25 mg Oral BID WC   enoxaparin (LOVENOX) injection  40 mg Subcutaneous Daily   sacubitril-valsartan  1 tablet Oral BID   spironolactone  25 mg Oral Daily   Continuous Infusions:  PRN Meds: ondansetron **OR** ondansetron (ZOFRAN) IV  Allergies:    Allergies  Allergen Reactions   Aspirin Hives   Tomato Hives, Itching and Swelling   Citric Acid Hives   Nabumetone Other (See Comments)    Throat itching    Social History:   Social History   Socioeconomic History   Marital status: Single    Spouse name: Not on file   Number of children: Not on file   Years of education: Not on file   Highest education level: Not on file  Occupational History   Not on file  Tobacco Use   Smoking status: Former    Packs/day: 0.50    Years: 8.00    Total pack years: 4.00    Types: Cigarettes    Quit date: 10/02/2010    Years since quitting: 11.5   Smokeless tobacco: Never  Vaping Use   Vaping Use: Never used  Substance and Sexual Activity   Alcohol use: Never   Drug use: Never   Sexual activity: Not on file  Other Topics Concern   Not on file  Social History Narrative   Not on file   Social Determinants of Health   Financial Resource Strain: Not on file  Food Insecurity: Not on file  Transportation Needs: Not on file  Physical Activity: Not on file  Stress: Not on file  Social Connections: Not on file  Intimate Partner Violence: Not on file     Family History:   No family history on file.   ROS:  Please see the history of present illness.  General:no colds or fevers, no weight changes Skin:no rashes or ulcers HEENT:no blurred vision, no congestion CV:see HPI PUL:see HPI GI:no diarrhea constipation or melena, no indigestion GU:no hematuria, no dysuria MS:no joint pain, no claudication Neuro:no syncope, no lightheadedness Endo:no diabetes, + thyroid disease  All other ROS reviewed and negative.     Physical Exam/Data:   Vitals:   04/14/22 0745 04/14/22 0800 04/14/22 1000  04/14/22 1148  BP: 113/78 110/74 (!) 127/97   Pulse: 83 92 86   Resp: _0 Temp: 97.7 F (36.5 C)   98.8 F (37.1 C)  TempSrc: Oral   Oral  SpO2: 99% 99% 100%    No intake or output data in the 24 hours ending 04/14/22 1201    02/26/2022    1:02 PM 02/25/2022    1:43 PM 06/11/2020   12:14 AM  Last 3 Weights  Weight (lbs) 220 lb 225 lb 210 lb  Weight (kg) 99.791 kg 102.059 kg 95.255 kg     There is no height or weight on file to calculate BMI.  General:  Well nourished, well developed, in no acute distress HEENT: normal Neck: no JVD Vascular: No carotid bruits; Distal pulses 2+ bilaterally Cardiac:  normal S1, S2; RRR; no murmur  gallup rub or click Lungs:  clear to auscultation bilaterally, no wheezing, rhonchi or rales  Abd: soft, nontender, no hepatomegaly  Ext: no edema Musculoskeletal:  No deformities, BUE and BLE strength normal and equal Skin: warm and dry  Neuro:  alert and oriented X 3 MAE follows commands, no focal abnormalities noted Psych:  Normal affect    Relevant CV Studies:  Echo 01/06/22   -----------------------------------------------  AORTIC ROOT          Size: Not seen    Dissection: INDETERM FOR DISSECTION   AORTIC VALVE      Leaflets: Tricuspid             Morphology: Normal      Mobility: Fully Mobile   LEFT VENTRICLE                                      Anterior: Normal          Size: Normal                                  Lateral: Normal   Contraction: Normal                                  Septal: Normal    Closest EF: >55%(Estimated)  Calc.EF: 59% (3D)      Apical: Normal     LV masses: No Masses                             Inferior: Normal           LVH: MILD LVH                             Posterior: Normal   LV GLS(GE): -17.3% Normal Range [ <= -16]  Dias.FxClass: Normal   MITRAL VALVE      Leaflets: Normal                  Mobility: Fully mobile    Morphology: Normal   LEFT ATRIUM          Size: Normal     LA masses: No masses                Normal IAS   MAIN PA  Size: Normal   PULMONIC VALVE    Morphology: Normal      Mobility: Fully Mobile   RIGHT VENTRICLE          Size: Normal                    Free wall: Normal   Contraction: Normal                    RV masses: No Masses         TAPSE:   2.7 cm,  Normal Range [>= 1.6 cm]   TRICUSPID VALVE      Leaflets: Normal                  Mobility: Fully mobile    Morphology: Normal   RIGHT ATRIUM          Size: Normal                     RA Other: None     RA masses: No masses   PERICARDIUM         Fluid: No effusion   INFERIOR VENACAVA          Size: Normal     Normal respiratory collapse   DOPPLER ECHO and OTHER SPECIAL PROCEDURES ------------------------------------     Aortic: N/A                    No AS      Mitral: N/A                    No MS     MV Inflow E Vel.= 52.0 cm/s  MV Annulus E'Vel.= 6.0 cm/s  E/E'Ratio= 9   Tricuspid: N/A                    No TS   Pulmonary: N/A                    No PS       Other:   INTERPRETATION ---------------------------------------------------------------    NORMAL LEFT VENTRICULAR SYSTOLIC FUNCTION WITH MILD LVH    NORMAL LA PRESSURES WITH NORMAL DIASTOLIC FUNCTION    NORMAL RIGHT VENTRICULAR SYSTOLIC FUNCTION    NO DOPPLER PERFORMED FOR VALVULAR REGURGITATION    NO VALVULAR STENOSIS    3D acquisition and reconstructions were performed as  part of this    examination to more accurately quantify the effects of Pre/Post Chemo,    Radiation or other therapy. (post-processing on an Independent    workstation).     Compared with prior Echo study on 11/26/2021: LVEF NOW NORMAL; GLS    DECREASED FROM -16.0 TO -17.3    Echocardiogram 12/19/20 INTERPRETATION --------------------------------------------------------------- NORMAL LEFT VENTRICULAR SYSTOLIC FUNCTION WITH MILD LVH NORMAL RIGHT VENTRICULAR SYSTOLIC FUNCTION VALVULAR REGURGITATION: TRIVIAL AR, TRIVIAL MR, TRIVIAL PR, TRIVIAL TR NO VALVULAR STENOSIS 3D acquisition and reconstructions were performed as part of this examination to more accurately quantify the effects of Pre/Post Chemo, Radiation or other therapy. (post-processing on an Independent workstation).   Compared with prior Echo study on 03/08/2020: IMPROVED LV GLS STRAIN. RV BETTER VISUALIZED TODAY.  Cardiac MRI 05/16/2020. Findings: 1. The left ventricle is normal in cavity size. There is mild basal sigmoid septal hypertrophy otherwise normal wall thickness. Systolic function is normal. The calculated LVEF is 59%. There are no regional wall motion abnormalities. 2. The right ventricle is normal in  cavity size, wall thickness, and systolic function.  3. The atria are normal in size. 4. The aortic valve is trileaflet in morphology. There is no significant aortic valve stenosis or regurgitation. There is no significant valvular disease.  5. Delayed enhancement imaging demonstrates no evidence of myocardial infarction, scar or infiltrative disease.  6. No intracardiac thrombus visualized.   CONCLUSION. Mild LVH. Normal LV systolic function with LVEF of 59%.  Cardiac MRI 10/10/2020 Findings: 1. The left ventricle is normal in cavity size. There is mild basal sigmoid septal hypertrophy otherwise normal wall thickness. Systolic function is low-normal. The calculated LVEF is 56% (59% previously).  There are no  regional wall motion abnormalities. 2. The right ventricle is normal in cavity size, wall thickness and systolic function.  3. The atria are normal in size. 4. The aortic valve is trileaflet in morphology. There is no significant aortic valve stenosis or regurgitation. There is no significant valvular disease.  5. Delayed enhancement imaging demonstrates no evidence of myocardial infarction, scar or infiltrative disease.  6. No intracardiac thrombus visualized. CONCLUSION. Low-normal LVEF is 56% (prior 59%. No evidence of MI, scarring, or infiltration. Laboratory Data:  High Sensitivity Troponin:   Recent Labs  Lab 04/13/22 1948 04/13/22 2145  TROPONINIHS 10 11     Chemistry Recent Labs  Lab 04/13/22 1948 04/13/22 1956 04/14/22 0424  NA 138 140 138  K 3.9 3.9 3.3*  CL 102 101 103  CO2 27  --  27  GLUCOSE 100* 97 134*  BUN 5* 4* <5*  CREATININE 1.00 0.90 0.88  CALCIUM 10.1  --  9.3  GFRNONAA >60  --  >60  ANIONGAP 9  --  8    Recent Labs  Lab 04/13/22 1948 04/14/22 0424  PROT 9.3* 7.8  ALBUMIN 3.9 3.2*  AST 92* 79*  ALT 40 38  ALKPHOS 90 78  BILITOT 0.5 0.5   Lipids No results for input(s): "CHOL", "TRIG", "HDL", "LABVLDL", "LDLCALC", "CHOLHDL" in the last 168 hours.  Hematology Recent Labs  Lab 04/13/22 1948 04/13/22 1956  WBC 8.8  --   RBC 4.91  --   HGB 14.3 15.3*  HCT 43.3 45.0  MCV 88.2  --   MCH 29.1  --   MCHC 33.0  --   RDW 14.9  --   PLT 190  --    Thyroid No results for input(s): "TSH", "FREET4" in the last 168 hours.  BNPNo results for input(s): "BNP", "PROBNP" in the last 168 hours.  DDimer No results for input(s): "DDIMER" in the last 168 hours.   Radiology/Studies:  CT Angio Chest PE W and/or Wo Contrast  Result Date: 04/14/2022 CLINICAL DATA:  Syncope with prodrome, cancer patient EXAM: CT ANGIOGRAPHY CHEST WITH CONTRAST TECHNIQUE: Multidetector CT imaging of the chest was performed using the standard protocol during bolus  administration of intravenous contrast. Multiplanar CT image reconstructions and MIPs were obtained to evaluate the vascular anatomy. RADIATION DOSE REDUCTION: This exam was performed according to the departmental dose-optimization program which includes automated exposure control, adjustment of the mA and/or kV according to patient size and/or use of iterative reconstruction technique. CONTRAST:  160m OMNIPAQUE IOHEXOL 350 MG/ML SOLN COMPARISON:  Radiographs earlier today. FINDINGS: Cardiovascular: Satisfactory opacification of the pulmonary arteries to the segmental level. No evidence of pulmonary embolism. Cardiomegaly. No pericardial effusion. Left chest wall Port-A-Cath tip in the right atrium. Mediastinum/Nodes: Ill-defined confluent consolidative opacity extending from the right hilum into the mediastinum. There is mild narrowing of the  right mainstem bronchus and the segmental branches of the right lower lobe bronchi. Lungs/Pleura: Mucous plugging in multiple right lower lobe bronchi. Consolidation and bronchiolectasis of the right upper lobe medially is also confluent with the right hilar opacity. Air trapping in the right upper lobe. Bibasilar atelectasis/scarring. Upper Abdomen: No acute abnormality.  Colonic diverticulosis. Musculoskeletal: Sclerosis of C7 and T2 likely due to history of osseous metastatic disease similar to CT 06/08/2020. Surgical clips right axilla. Left axillary lymph nodes measuring up to 8 mm in short axis. AVN both humeral heads. Review of the MIP images confirms the above findings. IMPRESSION: 1. Negative for acute pulmonary embolism. 2. Ill-defined consolidation about the right superior hilum and right upper lobe with volume loss and bronchiolectasis may be related to post radiation fibrosis though without comparison imaging malignancy is difficult to exclude. 3. There is mild narrowing of the right mainstem bronchus and air trapping in the right upper lobe. 4. Cardiomegaly. 5.  Sclerotic C7 and T2 vertebral bodies consistent with osseous metastatic disease unchanged from CT 06/08/2020 Electronically Signed   By: Placido Sou M.D.   On: 04/14/2022 00:11   DG Chest Portable 1 View  Result Date: 04/13/2022 CLINICAL DATA:  Port confirmation EXAM: PORTABLE CHEST 1 VIEW COMPARISON:  Radiographs 06/08/2020 FINDINGS: Accessed left chest wall Port-A-Cath with tip in the right atrium. Normal heart size. Hazy airspace opacity about the right superior hilum. No pleural effusion or pneumothorax. Surgical clips right axilla. IMPRESSION: 1. Hazy airspace opacity about the right superior hilum may be due to pneumonia. Follow-up is recommended to ensure resolution. 2. Left chest wall Port-A-Cath tip in the right atrium. Electronically Signed   By: Placido Sou M.D.   On: 04/13/2022 21:53     Assessment and Plan:   Syncope with bradycardia to 40s, and associated with N&V.   Home meds include coreg 25 BID, entresto 24/26 BID and spironolactone 25 daily.  Neg PE on CTA of chest. No mention of CAD-  no angina will check orthostatic vs.   HER2-positive carcinoma followed by [facility] HFrEF last EF >55% followed at [facility]. Euvolemic here.   No hx of CAD she did receive radiation for her breast cancer.  No noted CAD on CTA of chest.   Risk Assessment/Risk Scores:                For questions or updates, please contact [facility] Please consult [email or web address] for contact info under    Signed, Cecilie Kicks, NP  04/14/2022 12:01 PM  I have seen and examined the patient along with Cecilie Kicks, NP .  I have reviewed the chart, notes and new data.  I agree with PA/NP's note.  Key new complaints: clinical presentation highly consistent with vasovagal events; feels back to normal now. Does not have a long history of neurally mediated syncope, but associates previous events with cancer diagnosis. No symptoms of CHF at this time (NYHA class I) Key examination changes: obesity limits the  exam, but there are no signs of hypervolemia. Normal cardiac exam. Key new findings / data: most recent echo shows normal LVEF and only borderline low GLS. Normal ECG, NSR on telemetry, transient mild bradycardia during syncopal event  PLAN: - Vasovagal syncope with combined cardioinhibitory and vasodepressor mechanism (the latter probably dominant). Triggered by nausea and vomiting - History of chemo-related cardiomyopathy, essentially resolved on comprehensive medical therapy. Asymptomatic, not requiring loop diuretics to maintain euvolemia.  Recommend: - avoid triggers as much as possible - promptly recognize  prodrome of vagal event and lie horizontally; if actively vomiting, lay on one side - avoid dehydration - avoid standing upright in an immobile position for long periods of time  We cannot recommend salt loading, but I do think we should stop spironolactone to avoid sodium depletion. Continue entresto and carvedilol. Will send a note to her Cardiologist at [facility] explaining the events and why spironolactone was stopped.  Sanda Klein, MD, [place] [phone] 04/14/2022, 2:14 PM

## 2022-04-14 NOTE — Discharge Summary (Addendum)
[facility] Discharge Summary  Patient name: Maria Monroe Medical record number: [id number] Date of birth: 06-Mar-1971 Age: 51 y.o. Gender: female Date of Admission: 04/13/2022  Date of Discharge: 04/14/22 Admitting Physician: Leslie Dales, MD  Primary Care Provider: Gladstone Lighter, MD Consultants: Cardiology  Indication for Hospitalization: syncope  Brief Hospital Course:  Maria Monroe is a 51 y.o.female with a history of HER2 + carcinoma of right breast who was admitted to the [facility] Medicine Teaching Service at [facility] for episodes of syncope. Her hospital course is detailed below:   Syncope : Syncopal episodes after onset of nausea and vomiting, with one syncopal episode occurring in ED with recorded episode of bradycardia. On labs, found to be have slightly elevated AST 79 with normal CBC, CTA/PE negative and CXR revealed hazy opacities in right superior hilum but was otherwise unremarkable. Patient received IVF and zofran as needed. EKG NSR w/QTC 414, electrolytes repleted as indicated. Cardiology was consulted and recommended stopping spironolactone. Patient was discharged other home medications and spironolactone was discontinued. Patient received education on vasovagal precautions.  HER2 + carcinoma of right breast: Followed closely by [facility] with last appt 04/02/22 and received chemotherapy at that time.   Other chronic conditions were medically managed with home medications and formulary alternatives as necessary (HTN)  PCP Follow-up Recommendations: Follow up with possible CXR to ensure hazy opacities resolve in next few weeks, may be chronic finding from scarring 2/2 radiation and chemotherapy. F/u with oncology for further treatment, sent home with Zofran for management of nausea. Patient should f/u with her cardiologist at [facility] for vasovagal syncope while on Trastuzumab.     Disposition: home  Discharge Condition:  stable  Discharge Exam:  Vitals:   04/14/22 1000 04/14/22 1148  BP: (!) 127/97   Pulse: 86   Resp: 14   Temp:  98.8 F (37.1 C)  SpO2: 100%    Physical Exam: General: NAD, lying comfortably in hospital bed Cardiovascular: RRR, no murmurs, no peripheral edema Respiratory: normal WOB on RA, CTAB, no wheezes, ronchi or rales Abdomen: soft, NTTP, no rebound or guarding Extremities: Moving all 4 extremities equally   Significant Procedures: none  Significant Labs and Imaging:  Recent Labs  Lab 04/13/22 1948 04/13/22 1956  WBC 8.8  --   HGB 14.3 15.3*  HCT 43.3 45.0  PLT 190  --    Recent Labs  Lab 04/13/22 1948 04/13/22 1956 04/14/22 0424  NA 138 140 138  K 3.9 3.9 3.3*  CL 102 101 103  CO2 27  --  27  GLUCOSE 100* 97 134*  BUN 5* 4* <5*  CREATININE 1.00 0.90 0.88  CALCIUM 10.1  --  9.3  ALKPHOS 90  --  78  AST 92*  --  79*  ALT 40  --  38  ALBUMIN 3.9  --  3.2*    DG Chest Portable 1 View 04/13/22 IMPRESSION: 1. Hazy airspace opacity about the right superior hilum may be due to pneumonia. Follow-up is recommended to ensure resolution. 2. Left chest wall Port-A-Cath tip in the right atrium.  CT Angio Chest PE W and/or Wo Contrast 04/13/22 IMPRESSION: 1. Negative for acute pulmonary embolism. 2. Ill-defined consolidation about the right superior hilum and right upper lobe with volume loss and bronchiolectasis may be related to post radiation fibrosis though without comparison imaging malignancy is difficult to exclude. 3. There is mild narrowing of the right mainstem bronchus and air trapping in the right upper lobe.  4. Cardiomegaly. 5. Sclerotic C7 and T2 vertebral bodies consistent with osseous metastatic disease unchanged from CT 06/08/2020  Results/Tests Pending at Time of Discharge: none  Discharge Medications:  Allergies as of 04/14/2022       Reactions   Aspirin Hives   Tomato Hives, Itching, Swelling   Citric Acid Hives   Nabumetone Other  (See Comments)   Throat itching        Medication List     STOP taking these medications    diphenhydrAMINE 25 mg capsule Commonly known as: BENADRYL   oxyCODONE 5 MG immediate release tablet Commonly known as: Oxy IR/ROXICODONE   spironolactone 25 MG tablet Commonly known as: ALDACTONE       TAKE these medications    carvedilol 25 MG tablet Commonly known as: COREG Take 25 mg by mouth 2 (two) times daily.   Entresto 24-26 MG Generic drug: sacubitril-valsartan Take 1 tablet by mouth 2 (two) times daily.   ondansetron 4 MG tablet Commonly known as: ZOFRAN Take 1 tablet (4 mg total) by mouth every 6 (six) hours as needed for nausea.   PRESCRIPTION MEDICATION Chemo infusion (cancer treatment) every 3 weeks at [facility]        Discharge Instructions: Please refer to Patient Instructions section of EMR for full details.  Patient was counseled important signs and symptoms that should prompt return to medical care, changes in medications, dietary instructions, activity restrictions, and follow up appointments.   Follow-Up Appointments: [facility], already scheduled by patient prior to discharge    Upper Level Addendum:  I have reviewed the above note, making necessary revisions as appropriate.  I agree with the medical decision making from Dr. Ruben Im and physical exam as noted above.  Erskine Emery, MD PGY-2 [facility] Residency   Salvadore Oxford, MD 04/14/2022, 2:44 PM PGY-1, [place]

## 2022-04-14 NOTE — Hospital Course (Addendum)
Maria Monroe is a 51 y.o.female with a history of HER2 + carcinoma of right breast who was admitted to the [facility] Medicine Teaching Service at [facility] for episodes of syncope. Her hospital course is detailed below:   Syncope : Syncopal episodes after onset of nausea and vomiting, with one syncopal episode occurring in ED with recorded episode of bradycardia. On labs, found to be have slightly elevated AST 79 with normal CBC, CTA/PE negative and CXR revealed hazy opacities in right superior hilum but was otherwise unremarkable. Patient received IVF and zofran as needed. EKG NSR w/QTC 414, electrolytes repleted as indicated. Cardiology was consulted and recommended stopping spironolactone. Patient was discharged other home medications and spironolactone was discontinued. Patient received education on vasovagal precautions.  HER2 + carcinoma of right breast: Followed closely by [facility] with last appt 04/02/22 and received chemotherapy at that time.   Other chronic conditions were medically managed with home medications and formulary alternatives as necessary (HTN)  PCP Follow-up Recommendations: Follow up with possible CXR to ensure hazy opacities resolve in next few weeks, may be chronic finding from scarring 2/2 radiation and chemotherapy. F/u with oncology for further treatment, sent home with Zofran for management of nausea. Patient should f/u with her cardiologist at [facility] for vasovagal syncope while on Trastuzumab.

## 2022-04-14 NOTE — ED Notes (Signed)
Pt provided with [place] sandwich, apple sauce, and ginger ale. Tolerating well.

## 2022-04-14 NOTE — Assessment & Plan Note (Addendum)
Patient symptoms have resolved with fluids, no further episodes of bradycardia. Possibly result of prior radiation to chest and affecting vagal nerve. - Consult cardiology, recs appreciated - Med telemetry 24 hours - Zofran as needed - Regular diet and encourage hydration - Possible discharge for tomorrow pending patient's clinical improvement

## 2022-04-14 NOTE — ED Notes (Signed)
Patient stated "feels motion sickness from moving in wheelchair" patient experienced an episode of vomitting and experienced bradycardia to 40s BPM. MD Sabra Heck at bedside and aware of the situation. See new orders.

## 2022-04-14 NOTE — Progress Notes (Signed)
     Daily Progress Note Intern Pager: [phone]  Patient name: Maria Monroe Medical record number: [id number] Date of birth: 03-12-1971 Age: 51 y.o. Gender: female  Primary Care Provider: Gladstone Lighter, MD Consultants: Cardiology Code Status: FULL  Pt Overview and Major Events to Date:  -04/14/22 Admitted  Assessment and Plan:  Maria Monroe is a 51 y.o. female who presented with vomiting associated with syncope. She was admitted for syncope work-up. Pertinent PMH/PSH includes HTN, metastatic breast cancer, currently on chemotherapy.   * Syncope Patient symptoms have resolved with fluids, no further episodes of bradycardia. Possibly result of prior radiation to chest and affecting vagal nerve. - Consult cardiology, recs appreciated - Med telemetry 24 hours - Zofran as needed - Regular diet and encourage hydration - Possible discharge for tomorrow pending patient's clinical improvement     HER2-positive carcinoma of right breast [facility]) Patient is closely followed by oncology with [facility].  Last appointment was on August 2, and she also received her chemoinfusion.  Patient reports that she has not had prior GI problems with this current regimen.    FEN/GI: Regular PPx: Lovenox Dispo: Pending Cardiology recommendations  Subjective:  NAE since seeing night team. Denies nausea, vomiting, chest pain, shortness of breath, diarrhea, lightheadness, dizziness, or vision going black. States she feels better.  Objective: Temp:  [97.6 F (36.4 C)-98.8 F (37.1 C)] 98.8 F (37.1 C) (08/14 1148) Pulse Rate:  [64-100] 86 (08/14 1000) Resp:  [12-26] 14 (08/14 1000) BP: (110-189)/(74-125) 127/97 (08/14 1000) SpO2:  [96 %-100 %] 100 % (08/14 1000) Physical Exam: General: NAD, lying comfortably in hospital bed Cardiovascular: RRR, no murmurs, no peripheral edema Respiratory: normal WOB on RA, CTAB, no wheezes, ronchi or rales Abdomen: soft, NTTP, no  rebound or guarding Extremities: Moving all 4 extremities equally   Laboratory: Most recent CBC Lab Results  Component Value Date   WBC 8.8 04/13/2022   HGB 15.3 (H) 04/13/2022   HCT 45.0 04/13/2022   MCV 88.2 04/13/2022   PLT 190 04/13/2022   Most recent BMP    Latest Ref Rng & Units 04/14/2022    4:24 AM  BMP  Glucose 70 - 99 mg/dL 134   BUN 6 - 20 mg/dL <5   Creatinine 0.44 - 1.00 mg/dL 0.88   Sodium 135 - 145 mmol/L 138   Potassium 3.5 - 5.1 mmol/L 3.3   Chloride 98 - 111 mmol/L 103   CO2 22 - 32 mmol/L 27   Calcium 8.9 - 10.3 mg/dL 9.3     Other pertinent labs: none   Imaging/Diagnostic Tests: No new imaging.  Salvadore Oxford, MD 04/14/2022, 12:00 PM  PGY-1, [place] Intern pager: [phone], text pages welcome Secure chat group [place]

## 2022-04-14 NOTE — ED Notes (Signed)
Patient transfers to [facility] for elimination

## 2022-04-14 NOTE — Discharge Instructions (Addendum)
Dear Maria Monroe,   Thank you so much for allowing [place] to be part of your care!  You were admitted to [facility] for episodes of passing out.   You were treated with fluids and zofran to prevent vomiting and nausea as able   Cardiology recommended HOLDING spironolactone and to try to prevent sodium/water depletion as able   Avoid triggers if possible, stay well hydrated, promptly lay flat when you feel symptoms occur.  POST-HOSPITAL & CARE INSTRUCTIONS Follow up with [facility]  We are stopping the spironolactone as well  Please let PCP/Specialists know of any changes that were made.  Please see medications section of this packet for any medication changes.   DOCTOR'S APPOINTMENT & FOLLOW UP CARE INSTRUCTIONS  No future appointments.  RETURN PRECAUTIONS:  Please return if these happen more frequently, you experience shortness of breath, chest pain, swelling in the legs.   Take care and be well!  [facility]  [address]ior, [place] [id number] [phone]

## 2022-04-14 NOTE — H&P (Addendum)
Hospital Admission History and Physical Service Pager: [phone]  Patient name: Maria Monroe Medical record number: [id number] Date of Birth: 1970/12/23 Age: 51 y.o. Gender: female  Primary Care Provider: Gladstone Lighter, MD Consultants: Cardiology Code Status: Full Preferred Emergency Contact: Reinaldo Berber [phone] (Daughter) and Laurey Morale [phone] (son-in-law)  Chief Complaint: Syncope  Assessment and Plan: Maria Monroe is a 51 y.o. female presenting with syncopal episode while vomiting associated with bradycardia. Likely vasovagal syncope from vomiting possibly viral gastroenteritis and/or chemotherapy related. Differential for presentation of this also includes arrhythmia (not seen so far), PE (ruled out by imaging), orthostatic hypotension.  * Syncope Nausea is improved at time of admission.   - Admit to FMTS for observation, attending Dr. Nori Riis. - no further workup needed per cardiology - Med telemetry 24 hours - d/c IV fluids at this time - Zofran as needed - A.m. BMP - Regular diet and encourage hydration - Possible discharge for tomorrow pending patient's clinical improvement     HER2-positive carcinoma of right breast [facility]) Patient is closely followed by oncology with [facility].  Last appointment was on August 2, and she also received her chemoinfusion.  Patient reports that she has not had prior GI problems with this current regimen.  Other conditions chronic and stable: - Hypertension: Continue home carvedilol 25 mg twice daily, Entresto 24 to 26 mg twice daily, spironolactone 25 mg once daily    FEN/GI: Regular diet VTE Prophylaxis: Lovenox  Disposition: Med telemetry.  Observe overnight for improvement.  History of Present Illness:  Maria Monroe is a 51 y.o. female presenting with syncopal event on August 13 at home following nausea and vomiting.  Patient reports she has had "indigestion" starting two days  ago. Earlier in the afternoon, she had some nausea and vomiting and had a syncopal episode right after vomiting around noon. Had radiation therapy October 2022. She is currently undergoing chemotherapy (trastuzumab), last infusion was about 2 weeks ago (gets them every 3 weeks, next cycle 8/23).  She typically does not have GI problems with her current chemotherapy regimen.  She reports she had a syncopal episode associated with vomiting years ago, which resolved. She reports nausea has resolved at this time after receiving Zofran in the ED.    In the ED, patient had recorded episode of bradycardia (heart rate of 20 on monitor) and syncope following episode of emesis while provider was conducting physical exam.  Cardiology was consulted, and reviewed the patient's chart.  Cardiology believes this may be a vasovagal syncope and not cardiac in origin at this time.  She received Zofran.  Due to continued episodes of vomiting and syncope while in the ED, FMTS was consulted for admission to observe overnight.  Review Of Systems: Per HPI with the following additions:  Review of Systems  Constitutional:  Positive for malaise/fatigue. Negative for chills and fever.  Respiratory:  Negative for cough and shortness of breath.   Cardiovascular:  Negative for chest pain and palpitations.  Gastrointestinal:  Positive for constipation and vomiting. Negative for abdominal pain, blood in stool, diarrhea and melena.  Neurological:  Positive for loss of consciousness. Negative for dizziness, weakness and headaches.     Pertinent Past Medical History: -Metastatic breast cancer currently being treated with chemotherapy, followed by oncology. 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   Pertinent Past Surgical History: - Mastectomy - Port-A-Cath insertion left Remainder reviewed in history tab.   Pertinent Social History: Daughter and son-in-law are temporarily living  with her.  Pertinent Family  History: Multiple family members on father side with neoplasm.  Remainder reviewed in history tab.   Important Outpatient Medications: Carvedilol 25 mg twice a day Entresto 24-26 mg twice a day Spironolactone 25 mg once per day  Remainder reviewed in medication history.   Objective: BP (!) 158/119   Pulse 94   Temp 97.6 F (36.4 C) (Oral)   Resp 12   SpO2 96%  Exam: General: Pleasantly conversing, not in acute distress Cardiovascular: Regular rate and rhythm, no murmurs rubs or gallops. Respiratory: Clear to auscultation bilaterally Gastrointestinal: Bowel sounds present, no tenderness to palpation. Derm: Skin is dry and no lesions present Neuro: Alert and oriented x4 Psych: Positive affect  Labs:  CBC BMET  Recent Labs  Lab 04/13/22 1948 04/13/22 1956  WBC 8.8  --   HGB 14.3 15.3*  HCT 43.3 45.0  PLT 190  --    Recent Labs  Lab 04/13/22 1948 04/13/22 1956  NA 138 140  K 3.9 3.9  CL 102 101  CO2 27  --   BUN 5* 4*  CREATININE 1.00 0.90  GLUCOSE 100* 97  CALCIUM 10.1  --     Pertinent additional labs: -Negative troponin -Lipase within normal limits   EKG: Most recent ECG shows normal sinus rhythm   Imaging Studies Performed:  CT angiography Impression from Radiologist: Negative for acute pulmonary embolism.  Consolidation in the right superior hilum and right upper lobe with volume loss and bronchiolectasis may be related to postradiation fibrosis, possible malignancy.  Cardiomegaly present.  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  My Interpretation: Consolidation right superior hilum and right upper lobe.  Negative for PE.   Leslie Dales, MD 04/14/2022, 1:53 AM PGY-1, [place] Intern pager: [phone], text pages welcome Secure chat group [place]   On care everywhere, last echocardiogram 12/2021 reveals mild  LVH, EF 59%, overall unremarkable.  I was personally present and performed or re-performed the history, physical exam and medical decision making activities of this service and have verified that the service and findings are accurately documented in the resident's note.  Zola Button, MD                  04/14/2022, 2:00 AM

## 2022-04-14 NOTE — ED Notes (Signed)
Pt request labs pulled from port

## 2022-04-14 NOTE — Assessment & Plan Note (Signed)
Patient is closely followed by oncology with [facility].  Last appointment was on August 2, and she also received her chemoinfusion.  Patient reports that she has not had prior GI problems with this current regimen.

## 2022-04-14 NOTE — ED Notes (Signed)
Patient continues to have nausea and vomiting. States she is "unable to lay flat for CT scan" MD made aware of situation.

## 2022-12-15 ENCOUNTER — Observation Stay (HOSPITAL_COMMUNITY)
Admission: EM | Admit: 2022-12-15 | Discharge: 2022-12-16 | Disposition: A | Discharge status: Home / Self Care | Payer: 59 | Attending: Internal Medicine | Admitting: Internal Medicine

## 2022-12-15 ENCOUNTER — Encounter (HOSPITAL_COMMUNITY): Payer: Self-pay

## 2022-12-15 ENCOUNTER — Emergency Department (HOSPITAL_COMMUNITY): Payer: 59

## 2022-12-15 ENCOUNTER — Other Ambulatory Visit: Payer: Self-pay

## 2022-12-15 DIAGNOSIS — Z79899 Other long term (current) drug therapy: Secondary | ICD-10-CM | POA: Insufficient documentation

## 2022-12-15 DIAGNOSIS — J3801 Paralysis of vocal cords and larynx, unilateral: Secondary | ICD-10-CM

## 2022-12-15 DIAGNOSIS — Z86718 Personal history of other venous thrombosis and embolism: Secondary | ICD-10-CM | POA: Insufficient documentation

## 2022-12-15 DIAGNOSIS — J040 Acute laryngitis: Secondary | ICD-10-CM | POA: Diagnosis not present

## 2022-12-15 DIAGNOSIS — I5022 Chronic systolic (congestive) heart failure: Secondary | ICD-10-CM | POA: Diagnosis not present

## 2022-12-15 DIAGNOSIS — Z87891 Personal history of nicotine dependence: Secondary | ICD-10-CM | POA: Diagnosis not present

## 2022-12-15 DIAGNOSIS — J989 Respiratory disorder, unspecified: Secondary | ICD-10-CM

## 2022-12-15 DIAGNOSIS — I5020 Unspecified systolic (congestive) heart failure: Secondary | ICD-10-CM

## 2022-12-15 DIAGNOSIS — R0600 Dyspnea, unspecified: Secondary | ICD-10-CM

## 2022-12-15 DIAGNOSIS — J45909 Unspecified asthma, uncomplicated: Secondary | ICD-10-CM | POA: Insufficient documentation

## 2022-12-15 DIAGNOSIS — Z170 Estrogen receptor positive status [ER+]: Secondary | ICD-10-CM

## 2022-12-15 DIAGNOSIS — C50911 Malignant neoplasm of unspecified site of right female breast: Secondary | ICD-10-CM | POA: Diagnosis not present

## 2022-12-15 DIAGNOSIS — R0981 Nasal congestion: Secondary | ICD-10-CM | POA: Insufficient documentation

## 2022-12-15 DIAGNOSIS — I110 Hypertensive heart disease with heart failure: Secondary | ICD-10-CM | POA: Diagnosis not present

## 2022-12-15 DIAGNOSIS — C7951 Secondary malignant neoplasm of bone: Secondary | ICD-10-CM | POA: Diagnosis not present

## 2022-12-15 DIAGNOSIS — R0602 Shortness of breath: Secondary | ICD-10-CM | POA: Diagnosis present

## 2022-12-15 DIAGNOSIS — J383 Other diseases of vocal cords: Secondary | ICD-10-CM

## 2022-12-15 LAB — CBC WITH DIFFERENTIAL/PLATELET
Abs Immature Granulocytes: 0.03 10*3/uL (ref 0.00–0.07)
Basophils Absolute: 0 10*3/uL (ref 0.0–0.1)
Basophils Relative: 0 %
Eosinophils Absolute: 0 10*3/uL (ref 0.0–0.5)
Eosinophils Relative: 1 %
HCT: 38.4 % (ref 36.0–46.0)
Hemoglobin: 12.9 g/dL (ref 12.0–15.0)
Immature Granulocytes: 0 %
Lymphocytes Relative: 15 %
Lymphs Abs: 1.2 10*3/uL (ref 0.7–4.0)
MCH: 30.4 pg (ref 26.0–34.0)
MCHC: 33.6 g/dL (ref 30.0–36.0)
MCV: 90.6 fL (ref 80.0–100.0)
Monocytes Absolute: 0.2 10*3/uL (ref 0.1–1.0)
Monocytes Relative: 3 %
Neutro Abs: 6.5 10*3/uL (ref 1.7–7.7)
Neutrophils Relative %: 81 %
Platelets: 142 10*3/uL — ABNORMAL LOW (ref 150–400)
RBC: 4.24 MIL/uL (ref 3.87–5.11)
RDW: 14.1 % (ref 11.5–15.5)
WBC: 8 10*3/uL (ref 4.0–10.5)
nRBC: 0 % (ref 0.0–0.2)

## 2022-12-15 LAB — I-STAT CHEM 8, ED
BUN: 7 mg/dL (ref 6–20)
Calcium, Ion: 1.16 mmol/L (ref 1.15–1.40)
Chloride: 103 mmol/L (ref 98–111)
Creatinine, Ser: 0.8 mg/dL (ref 0.44–1.00)
Glucose, Bld: 102 mg/dL — ABNORMAL HIGH (ref 70–99)
HCT: 38 % (ref 36.0–46.0)
Hemoglobin: 12.9 g/dL (ref 12.0–15.0)
Potassium: 3.8 mmol/L (ref 3.5–5.1)
Sodium: 139 mmol/L (ref 135–145)
TCO2: 26 mmol/L (ref 22–32)

## 2022-12-15 MED ORDER — LORATADINE 10 MG PO TABS: 10.0000 mg | ORAL_TABLET | Freq: Every day | ORAL | Status: DC

## 2022-12-15 MED ORDER — SACUBITRIL-VALSARTAN 24-26 MG PO TABS
1.0000 | ORAL_TABLET | Freq: Two times a day (BID) | ORAL | Status: DC
  Administered 2022-12-15 – 2022-12-16 (×2): 1 via ORAL
  Filled 2022-12-15 (×2): qty 1

## 2022-12-15 MED ORDER — CETIRIZINE HCL 5 MG/5ML PO SOLN: 5.0000 mg | Freq: Once | ORAL | Status: DC

## 2022-12-15 MED ORDER — LORATADINE 10 MG PO TABS
10.0000 mg | ORAL_TABLET | Freq: Once | ORAL | Status: AC
  Administered 2022-12-15: 10 mg via ORAL
  Filled 2022-12-15: qty 1

## 2022-12-15 MED ORDER — PREDNISONE 5 MG PO TABS
50.0000 mg | ORAL_TABLET | Freq: Once | ORAL | Status: AC
  Administered 2022-12-15: 50 mg via ORAL
  Filled 2022-12-15: qty 2

## 2022-12-15 MED ORDER — RACEPINEPHRINE HCL 2.25 % IN NEBU
0.5000 mL | INHALATION_SOLUTION | Freq: Once | RESPIRATORY_TRACT | Status: AC
  Administered 2022-12-15: 0.5 mL via RESPIRATORY_TRACT
  Filled 2022-12-15: qty 0.5

## 2022-12-15 MED ORDER — ALBUTEROL SULFATE (2.5 MG/3ML) 0.083% IN NEBU
INHALATION_SOLUTION | RESPIRATORY_TRACT | Status: AC
  Administered 2022-12-15: 2.5 mg via RESPIRATORY_TRACT
  Filled 2022-12-15: qty 3

## 2022-12-15 MED ORDER — ALBUTEROL SULFATE (2.5 MG/3ML) 0.083% IN NEBU: 2.5000 mg | INHALATION_SOLUTION | Freq: Once | RESPIRATORY_TRACT | Status: AC

## 2022-12-15 MED ORDER — DEXAMETHASONE SODIUM PHOSPHATE 10 MG/ML IJ SOLN
10.0000 mg | Freq: Once | INTRAMUSCULAR | Status: AC
  Administered 2022-12-15: 10 mg via INTRAVENOUS
  Filled 2022-12-15: qty 1

## 2022-12-15 NOTE — ED Provider Notes (Signed)
[place] EMERGENCY DEPARTMENT AT [facility] Provider Note   CSN: [id number] Arrival date & time: 12/15/22  1354     History  No chief complaint on file.   Maria Monroe is a 52 y.o. female.  HPI    52 y.o female with history of right breast carcinoma with metastases, getting treatment at [facility] comes in with chief complaint of throat irritation.  Patient states that she has received radiation of her spine because of metastases, and had vocal cord dysfunction/paralysis 2 years back.  Over the last several days she has had some congestion.  More recently she has been feeling like it has been difficult for her to talk and today she was feeling like her throat was closing, prompting her to come to the ER.  She was feeling short of breath.  She received nebulizer treatment in the ER and feels better.  She was prescribed viscous lidocaine by her oncology team, but that did not help her significantly with her symptoms.  During my assessment, patient states that she feels a lot better after receiving the breathing treatment.  She states that at 1 point she was having audible wheezing, but that has not resolved.  She had no point had drooling.  Home Medications Prior to Admission medications   Medication Sig Start Date End Date Taking? Authorizing Provider  carvedilol (COREG) 25 MG tablet Take 25 mg by mouth 2 (two) times daily. 12/23/21   [provider]  ondansetron (ZOFRAN) 4 MG tablet Take 1 tablet (4 mg total) by mouth every 6 (six) hours as needed for nausea. 04/14/22   Alfredo Martinez, MD  PRESCRIPTION MEDICATION Chemo infusion (cancer treatment) every 3 weeks at [facility]    [provider]  sacubitril-valsartan (ENTRESTO) 24-26 MG Take 1 tablet by mouth 2 (two) times daily.    [provider]      Allergies    Aspirin, Tomato, Citric acid, and Nabumetone    Review of Systems   Review of Systems  All other systems reviewed and are  negative.   Physical Exam Updated Vital Signs BP (!) 141/100   Pulse 83   Temp 98.3 F (36.8 C)   Resp 18   SpO2 98%  Physical Exam Vitals and nursing note reviewed.  Constitutional:      Appearance: She is well-developed.  HENT:     Head: Atraumatic.  Cardiovascular:     Rate and Rhythm: Normal rate.  Pulmonary:     Effort: Pulmonary effort is normal.     Breath sounds: No stridor. No wheezing.  Musculoskeletal:     Cervical back: Normal range of motion and neck supple.  Skin:    General: Skin is warm and dry.  Neurological:     Mental Status: She is alert and oriented to person, place, and time.     ED Results / Procedures / Treatments   Labs (all labs ordered are listed, but only abnormal results are displayed) Labs Reviewed  CBC WITH DIFFERENTIAL/PLATELET  I-STAT CHEM 8, ED    EKG None  Radiology DG Neck Soft Tissue  Result Date: 12/15/2022 CLINICAL DATA:  Recent stridor that has resolved EXAM: NECK SOFT TISSUES - 2 VIEW COMPARISON:  None Available. FINDINGS: No prevertebral soft tissue swelling or gas. Preserved epiglottis. No radiopaque foreign body. Jewelry obscures the C1-2 level on the lateral view. Degenerative changes seen of the cervical spine particularly along the facet joints as well as along the disc spaces along the  mid cervical spine from C4 through C6. Central catheter seen at the edge of the imaging field along the thorax. Additional cross-sectional study as clinically appropriate IMPRESSION: Degenerative changes of the spine. No prevertebral soft tissue swelling gas. Electronically Signed   By: Karen Kays M.D.   On: 12/15/2022 16:16    Procedures .Critical Care  Performed by: Derwood Kaplan, MD Authorized by: Derwood Kaplan, MD   Critical care provider statement:    Critical care time (minutes):  41   Critical care was necessary to treat or prevent imminent or life-threatening deterioration of the following conditions:  Respiratory  failure   Critical care was time spent personally by me on the following activities:  Development of treatment plan with patient or surrogate, discussions with consultants, evaluation of patient's response to treatment, examination of patient, ordering and review of laboratory studies, ordering and review of radiographic studies, ordering and performing treatments and interventions, pulse oximetry, re-evaluation of patient's condition, review of old charts and obtaining history from patient or surrogate     Medications Ordered in ED Medications  Racepinephrine HCl 2.25 % nebulizer solution 0.5 mL (has no administration in time range)  dexamethasone (DECADRON) injection 10 mg (has no administration in time range)  albuterol (PROVENTIL) (2.5 MG/3ML) 0.083% nebulizer solution 2.5 mg (2.5 mg Nebulization Given 12/15/22 1455)  predniSONE (DELTASONE) tablet 50 mg (50 mg Oral Given 12/15/22 1630)  loratadine (CLARITIN) tablet 10 mg (10 mg Oral Given 12/15/22 1630)    ED Course/ Medical Decision Making/ A&P                             Medical Decision Making Amount and/or Complexity of Data Reviewed Labs: ordered. Radiology: ordered.  Risk OTC drugs. Prescription drug management. Decision regarding hospitalization.   This patient presents to the ED with chief complaint(s) of throat discomfort with pertinent past medical history of metastatic breast cancer with radiation to her cervical spine leading to vocal cord paralysis few years back.The complaint involves an extensive differential diagnosis and also carries with it a high risk of complications and morbidity.    Patient also has history of asthma.  She received DuoNeb prior to my assessment, and feels a lot better.  The differential diagnosis includes : Vocal cord dysfunction, severe allergic reaction, angioedema, deep space infection of the neck.  History and exam not consistent with deep infection of the neck.  Currently no trismus,  drooling.   The initial plan is to get x-ray of the soft tissue neck.  Patient feels a lot better right now.  Will give her oral meds as well.  She does sound slightly hoarse.   Additional history obtained: Records reviewed Care Everywhere/External Records.  I have reviewed patient's CT angio head and neck that was completed earlier this year.  There was no evidence of any soft tissue dysfunction of the neck at that time.  Independent visualization and interpretation of imaging: - I independently visualized the following imaging with scope of interpretation limited to determining acute life threatening conditions related to emergency care: X-ray of the soft tissue neck, which revealed no evidence of airway compromise.  Treatment and Reassessment: Patient reassessed.  She states that her breathing has still remained fine, but she does feel like she has to put an extra effort to speak and breathing is still not back to baseline normal.  I suspect she likely has vocal cord dysfunction from the radiation therapy, but she  has metastases to the neck, specifically the C-spine.  I will discuss the case with our ENT team.  Consultation: - Consulted or discussed management/test interpretation with external professional: Dr. Jearld Fenton, ENT has seen the patient. He indicated that patient's good vocal cord is edematous, there is some airway narrowing and it is best to admit the patient.  We will need to give her round-the-clock breathing treatment and also steroids.  I will add IV steroid.  Will give her racemic epi nebulizer treatment now, and seek admission.   Final Clinical Impression(s) / ED Diagnoses Final diagnoses:  Nasal congestion  Vocal cord dysfunction  Dyspnea, unspecified type  Airway problem    Rx / DC Orders ED Discharge Orders     None         Derwood Kaplan, MD 12/15/22 1818

## 2022-12-15 NOTE — ED Notes (Signed)
PT is tachy HR167  116/70BP   update Alana

## 2022-12-15 NOTE — Assessment & Plan Note (Signed)
Getting active chemo at [facility].

## 2022-12-15 NOTE — Assessment & Plan Note (Signed)
Per [facility].   

## 2022-12-15 NOTE — ED Notes (Signed)
Pt able to maintain secretions. Pt is eupneic.

## 2022-12-15 NOTE — ED Notes (Signed)
Patient transported to X-ray 

## 2022-12-15 NOTE — Assessment & Plan Note (Signed)
No signs of acute decompensation, plan to resume entresto and carvedilol.

## 2022-12-15 NOTE — H&P (Addendum)
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  Patient's been having upper respiratory symptoms and sinus drainage for at least 2 days.  She became acutely short of breath while at the grocery store.  She was having difficulty talking.  Arrival to the ER temp 90.2 heart rate 80 blood pressure 137 over 99  Labs showed a white count 8.0, hemoglobin 12.9, platelets 142  Sodium 139, potassium 3.8, BUN of 7, creatinine 0.8  Soft tissue neck that showed degenerative spine changes.  ENT was consulted.  Patient had flexible nasopharyngeal laryngoscopy.  This showed left vocal cord paralysis in the median position.  Right vocal cord was mobile.  There is some mild swelling which does note the airway.  No significant erythema.  ENT recommended patient stay overnight for systemic steroids.  Patient is already received 1 nebulized epinephrine treatment.  [facility] hospitalist contacted for admission.   ED Course: ENT consult. Pt had NPL that showed left vocal cord paralysis and right vocal cord edema  Review of Systems:  Review of Systems  Constitutional: Negative.   HENT:  Positive for congestion, sinus pain and sore throat.   Eyes: Negative.   Respiratory:  Positive for shortness of breath and stridor.   Cardiovascular: Negative.   Gastrointestinal: Negative.   Genitourinary: Negative.   Musculoskeletal: Negative.   Skin: Negative.   Neurological: Negative.    Endo/Heme/Allergies: Negative.   Psychiatric/Behavioral: Negative.    All other systems reviewed and are negative.   Past Medical History:  Diagnosis Date   Arthritis    Asthma    Bone metastasis    Cancer    overlapping sites of right breast   DVT (deep venous thrombosis)    H/O bilateral mastectomy    HER2-positive carcinoma of right breast    Hypertension    Lymphedema of both arms     Past Surgical History:  Procedure Laterality Date   ABDOMINAL HYSTERECTOMY     BRONCHOSCOPY     x3   CESAREAN SECTION     x2   COLONOSCOPY N/A 02/26/2022   Procedure: COLONOSCOPY;  Surgeon: Toledo, Boykin Nearing, MD;  Location: [facility] ENDOSCOPY;  Service: Gastroenterology;  Laterality: N/A;   MASTECTOMY     PORTA CATH INSERTION Left    TUBAL LIGATION       reports that she quit smoking about 12 years ago. Her smoking use included cigarettes. She has a 4.00 pack-year smoking history. She has never used smokeless tobacco. She reports that she does not drink alcohol and does not use drugs.  Allergies  Allergen Reactions   Asa [Aspirin] Hives   Tomato Hives, Itching and Swelling   Citric Acid Hives   Corn-Containing Products Other (See Comments)    Told to avoid corn due to digestive issues by oncologist    Relafen [Nabumetone] Itching    Throat itching    History reviewed. No pertinent family history.  Prior to Admission medications   Medication Sig Start Date End Date Taking? Authorizing Provider  carvedilol (COREG) 25 MG tablet Take 25 mg by mouth 2 (two) times daily. 12/23/21  Yes [provider]  lidocaine (XYLOCAINE) 2 % solution Use as directed 5 mLs in the mouth or throat 4 (four) times daily as needed for mouth pain.   Yes [provider]  loratadine (CLARITIN) 10 MG tablet Take 10 mg by mouth daily as needed for allergies.   Yes [provider]  Multiple Vitamin (MULTIVITAMIN) tablet Take 1 tablet by mouth daily.   Yes [provider]   oxyCODONE (OXY IR/ROXICODONE) 5 MG immediate release tablet Take 5 mg by mouth every 4 (four) hours as needed for severe pain.   Yes [provider]  sacubitril-valsartan (ENTRESTO) 24-26 MG Take 1 tablet by mouth 2 (two) times daily.   Yes [provider]  ondansetron (ZOFRAN) 4 MG tablet Take 1 tablet (4 mg total) by mouth every 6 (six) hours as needed for nausea. Patient not taking: Reported on 12/15/2022 04/14/22   Alfredo Martinez, MD    Physical Exam: Vitals:   12/15/22 1730 12/15/22 2010 12/15/22 2030 12/15/22 2045  BP: (!) 141/100 118/78 (!) 135/99   Pulse: 83 90 89 85  Resp: 18  14   Temp: 98.3 F (36.8 C)  98.3 F (36.8 C)   TempSrc:   Oral   SpO2: 98% 97% 97% 100%    Physical Exam Vitals and nursing note reviewed.  Constitutional:      General: She is not in acute distress.    Appearance: She is not ill-appearing, toxic-appearing or diaphoretic.  HENT:     Head: Normocephalic and atraumatic.     Nose: Nose normal.  Eyes:     General: No scleral icterus. Cardiovascular:     Rate and Rhythm: Normal rate and regular rhythm.     Pulses: Normal pulses.     Heart sounds: Normal heart sounds.  Pulmonary:     Effort: Pulmonary effort is normal.     Breath sounds: Normal breath sounds.  Abdominal:     General: Bowel sounds are normal. There is no distension.     Palpations: Abdomen is soft.  Musculoskeletal:     Right lower leg: No edema.     Left lower leg: No edema.  Skin:    General: Skin is warm and dry.     Capillary Refill: Capillary refill takes less than 2 seconds.  Neurological:     General: No focal deficit present.     Mental Status: She is alert and oriented to person, place, and time.    Labs on Admission: I have personally reviewed following labs and imaging studies  CBC: Recent Labs  Lab 12/15/22 2001 12/15/22 2011  WBC 8.0  --   NEUTROABS 6.5  --   HGB 12.9 12.9  HCT 38.4 38.0  MCV 90.6  --   PLT 142*  --    Basic  Metabolic Panel: Recent Labs  Lab 12/15/22 2011  NA 139  K 3.8  CL 103  GLUCOSE 102*  BUN 7  CREATININE 0.80   GFR: CrCl cannot be calculated (Unknown ideal weight.).  Urine analysis:    Component Value Date/Time   COLORURINE YELLOW 04/13/2022 2350   APPEARANCEUR CLEAR 04/13/2022 2350   LABSPEC 1.010 04/13/2022 2350   PHURINE 7.0 04/13/2022 2350   GLUCOSEU NEGATIVE 04/13/2022 2350   HGBUR NEGATIVE 04/13/2022 2350   BILIRUBINUR NEGATIVE 04/13/2022 2350   KETONESUR NEGATIVE 04/13/2022 2350   PROTEINUR NEGATIVE 04/13/2022 2350  NITRITE NEGATIVE 04/13/2022 2350   LEUKOCYTESUR NEGATIVE 04/13/2022 2350    Radiological Exams on Admission: I have personally reviewed images DG Neck Soft Tissue  Result Date: 12/15/2022 CLINICAL DATA:  Recent stridor that has resolved EXAM: NECK SOFT TISSUES - 2 VIEW COMPARISON:  None Available. FINDINGS: No prevertebral soft tissue swelling or gas. Preserved epiglottis. No radiopaque foreign body. Jewelry obscures the C1-2 level on the lateral view. Degenerative changes seen of the cervical spine particularly along the facet joints as well as along the disc spaces along the mid cervical spine from C4 through C6. Central catheter seen at the edge of the imaging field along the thorax. Additional cross-sectional study as clinically appropriate IMPRESSION: Degenerative changes of the spine. No prevertebral soft tissue swelling gas. Electronically Signed   By: Karen Kays M.D.   On: 12/15/2022 16:16    EKG: My personal interpretation of EKG shows: no EKG  Assessment/Plan Principal Problem:   Laryngitis, acute Active Problems:   Breast cancer, right   Paralysis of left vocal cord   Malignant neoplasm metastatic to bone   HFrEF (heart failure with reduced ejection fraction)   Assessment and Plan: * Laryngitis, acute Observation progressive bed. Requested EDP to contact PCCM for consult. If pt's clinical condition worsens, pt may need emergent trach.  ENT did NPL that showed left vocal cord paralysis and edema of right vocal cord. Continue with PO prednisone. Prn epinephrine nebs. Clear liquid diet.  Malignant neoplasm metastatic to bone Chronic  Paralysis of left vocal cord Due to prior XRT to neck for her mets  Breast cancer, right Getting active chemo at [facility].  HFrEF (heart failure with reduced ejection fraction) Continue with coreg and entresto. Pt does not take any diuretics. Followed by [facility] cardiology.   DVT prophylaxis: SQ Heparin Code Status: Full Code Family Communication: no family at bedside  Disposition Plan: return to home  Consults called: ENT(Byers)  Admission status: Observation,  progressive   Carollee Herter, DO [facility] 12/15/2022, 9:52 PM

## 2022-12-15 NOTE — Consult Note (Signed)
Reason for Consult:resp distress Referring Physician: Dr Ulysees Barns is an 52 y.o. female.  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  She underwent radiation treatment to the cervical area and developed a vocal cord paralysis that she reports was left-sided.  She had a change in her voice but no airway problems with this.  Here recently she has had some upper respiratory infection and postnasal drip.  She has had some wheezing.  She also was having stridor.  She presented to the ER and underwent some breathing treatments and now the apparent wheezing has resolved and the stridor as well but she still feels a bit short of breath and tight in her throat.  She is still following with an oncologist and does get some chemotherapy.  Past Medical History:  Diagnosis Date   Arthritis    Asthma    Bone metastasis    Cancer    overlapping sites of right breast   DVT (deep venous thrombosis)    H/O bilateral mastectomy    HER2-positive carcinoma of right breast    Hypertension    Lymphedema of both arms     Past Surgical History:  Procedure Laterality Date   ABDOMINAL HYSTERECTOMY     BRONCHOSCOPY     x3   CESAREAN SECTION     x2   COLONOSCOPY N/A 02/26/2022   Procedure: COLONOSCOPY;  Surgeon: Toledo, Boykin Nearing, MD;  Location: [facility] ENDOSCOPY;  Service: Gastroenterology;  Laterality: N/A;   MASTECTOMY     PORTA CATH INSERTION Left    TUBAL LIGATION      History reviewed. No pertinent family history.  Social History:  reports that she quit smoking about 12 years ago. Her smoking use included cigarettes. She has a 4.00 pack-year smoking history. She has never used smokeless tobacco. She reports that she does not drink alcohol and does not use drugs.  Allergies:  Allergies  Allergen Reactions   Aspirin Hives   Tomato Hives, Itching and Swelling   Citric Acid Hives   Nabumetone Other (See Comments)    Throat itching     Medications: I have reviewed the patient's current medications.  No results found for this or any previous visit (from the past 48 hour(s)).  DG Neck Soft Tissue  Result Date: 12/15/2022 CLINICAL DATA:  Recent stridor that has resolved EXAM: NECK SOFT TISSUES - 2 VIEW COMPARISON:  None Available. FINDINGS: No prevertebral soft tissue swelling or gas. Preserved epiglottis. No radiopaque foreign body. Jewelry obscures the C1-2 level on the lateral view. Degenerative changes seen of the cervical spine particularly along the facet joints as well as along the disc spaces along the mid cervical spine from C4 through C6. Central catheter seen at the edge of the imaging field along the thorax. Additional cross-sectional study as clinically appropriate IMPRESSION: Degenerative changes of the spine. No prevertebral soft tissue swelling gas. Electronically Signed   By: Karen Kays M.D.   On: 12/15/2022 16:16    ROS Blood pressure (!) 141/100, pulse 83, temperature 98.3 F (36.8 C), resp. rate 18, SpO2 98 %. Physical Exam HENT:     Head:     Comments: She agreed to a fiberoptic exam.  The fiberoptic exam reveals the nasopharynx to be clear.  Nasal cavity without lesions.  The hypopharynx/larynx has no evidence of swelling or edema of the epiglottis or area epiglottic folds.  Her left vocal cord is paralyzed in the median position.  The right vocal cord moves but it does have some pseudo sulcus and mild swelling which does narrow the airway to a point of some concern for her being able to go home just yet.  There is no significant erythema.  The vocal cord does appear to move but her arytenoid does fold over the vocal cords on phonation which does limit the visualization.  I cannot assess the subglottis.    Nose: Nose normal.     Mouth/Throat:     Mouth: Mucous membranes are moist.  Neurological:     Mental Status: She is alert.       Assessment/Plan: Laryngitis with vocal cord paralysis-the left  vocal cord has been paralyzed since 2018.  She has not really had any significant issues but now she is having a feeling like air hunger and tightness with the breathing.  She does not have stridor but does have a medialized vocal cord paralysis on the left and a thickened vocal cord on the right.  Hopefully the edema of the right vocal cord will resolve with some treatment.  I would recommend admission for observation with breathing treatments and systemic steroids.  I do not think she will need a trach based on how she is doing currently.  Suzanna Obey 12/15/2022, 6:04 PM

## 2022-12-15 NOTE — ED Notes (Signed)
Patient reported to RN that she feels throat tightness making it hard for her to breathe similar to when she had a vocal cord collapse. RN informed PA and received a verbal order for albuterol.

## 2022-12-15 NOTE — Assessment & Plan Note (Signed)
Due to prior XRT to neck for her mets Her voice has recovered to baseline.

## 2022-12-15 NOTE — Subjective & Objective (Addendum)
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  Patient's been having upper respiratory symptoms and sinus drainage for at least 2 days.  She became acutely short of breath while at the grocery store.  She was having difficulty talking.  Arrival to the ER temp 90.2 heart rate 80 blood pressure 137 over 99  Labs showed a white count 8.0, hemoglobin 12.9, platelets 142  Sodium 139, potassium 3.8, BUN of 7, creatinine 0.8  Soft tissue neck that showed degenerative spine changes.  ENT was consulted.  Patient had flexible nasopharyngeal laryngoscopy.  This showed left vocal cord paralysis in the median position.  Right vocal cord was mobile.  There is some mild swelling which does note the airway.  No significant erythema.  ENT recommended patient stay overnight for systemic steroids.  Patient is already received 1 nebulized epinephrine treatment.  [facility] hospitalist contacted for admission.

## 2022-12-15 NOTE — ED Triage Notes (Signed)
Patient complains of throat tightness that started today, reports sinus issues and congestion. Patient speaking complete sentences and in no distress. Currently receiving chemo for breast cancer. NAD

## 2022-12-15 NOTE — Assessment & Plan Note (Addendum)
Patient was placed on systemic corticosteroids with improvement in her symptoms. Her dyspnea has improved and she is able to talk back to her baseline.  Her 02 saturation at the time of her discharge is 96% on room air.   Plan to continue with prednisone taper and follow up with ENT as outpatient.

## 2022-12-16 ENCOUNTER — Other Ambulatory Visit (HOSPITAL_COMMUNITY): Payer: Self-pay

## 2022-12-16 DIAGNOSIS — J040 Acute laryngitis: Secondary | ICD-10-CM | POA: Diagnosis not present

## 2022-12-16 DIAGNOSIS — I5020 Unspecified systolic (congestive) heart failure: Secondary | ICD-10-CM | POA: Diagnosis not present

## 2022-12-16 DIAGNOSIS — C50911 Malignant neoplasm of unspecified site of right female breast: Secondary | ICD-10-CM | POA: Diagnosis not present

## 2022-12-16 DIAGNOSIS — J3801 Paralysis of vocal cords and larynx, unilateral: Secondary | ICD-10-CM | POA: Diagnosis not present

## 2022-12-16 DIAGNOSIS — C7951 Secondary malignant neoplasm of bone: Secondary | ICD-10-CM | POA: Diagnosis not present

## 2022-12-16 MED ORDER — ACETAMINOPHEN 650 MG RE SUPP: 650.0000 mg | Freq: Four times a day (QID) | RECTAL | Status: DC | PRN

## 2022-12-16 MED ORDER — ACETAMINOPHEN 325 MG PO TABS: 650.0000 mg | ORAL_TABLET | Freq: Four times a day (QID) | ORAL | Status: DC | PRN

## 2022-12-16 MED ORDER — RACEPINEPHRINE HCL 2.25 % IN NEBU: 0.5000 mL | INHALATION_SOLUTION | RESPIRATORY_TRACT | Status: DC | PRN

## 2022-12-16 MED ORDER — CARVEDILOL 25 MG PO TABS
25.0000 mg | ORAL_TABLET | Freq: Two times a day (BID) | ORAL | Status: DC
  Administered 2022-12-16 (×2): 25 mg via ORAL
  Filled 2022-12-16 (×2): qty 2

## 2022-12-16 MED ORDER — HEPARIN SOD (PORK) LOCK FLUSH 100 UNIT/ML IV SOLN
500.0000 [IU] | INTRAVENOUS | Status: AC | PRN
  Administered 2022-12-16: 500 [IU]

## 2022-12-16 MED ORDER — ONDANSETRON HCL 4 MG PO TABS
4.0000 mg | ORAL_TABLET | Freq: Four times a day (QID) | ORAL | Status: DC | PRN
  Administered 2022-12-16: 4 mg via ORAL
  Filled 2022-12-16: qty 1

## 2022-12-16 MED ORDER — PREDNISONE 10 MG PO TABS
ORAL_TABLET | ORAL | 0 refills | Status: DC
  Filled 2022-12-16: qty 28, 14d supply, fill #0

## 2022-12-16 MED ORDER — OXYCODONE HCL 5 MG PO TABS: 5.0000 mg | ORAL_TABLET | ORAL | Status: DC | PRN

## 2022-12-16 MED ORDER — ONDANSETRON HCL 4 MG/2ML IJ SOLN: 4.0000 mg | Freq: Four times a day (QID) | INTRAMUSCULAR | Status: DC | PRN

## 2022-12-16 MED ORDER — HEPARIN SODIUM (PORCINE) 5000 UNIT/ML IJ SOLN
5000.0000 [IU] | Freq: Three times a day (TID) | INTRAMUSCULAR | Status: DC
  Administered 2022-12-16 (×2): 5000 [IU] via SUBCUTANEOUS
  Filled 2022-12-16 (×2): qty 1

## 2022-12-16 MED ORDER — PREDNISONE 20 MG PO TABS
40.0000 mg | ORAL_TABLET | Freq: Every day | ORAL | Status: DC
  Administered 2022-12-16: 40 mg via ORAL
  Filled 2022-12-16: qty 2

## 2022-12-16 NOTE — Discharge Summary (Signed)
Physician Discharge Summary   Patient: Maria Monroe MRN: [id number] DOB: Oct 22, 1970  Admit date:     12/15/2022  Discharge date: 12/16/22  Discharge Physician: Coralie Keens   PCP: Enid Baas, MD   Recommendations at discharge:    Patient will continue taking prednisone taper as outpatient. Patient was instructed to return to the ED in case of recurrent symptoms.  Follow up with Dr Nemiah Commander in 7 to 10 days.  Discharge Diagnoses: Principal Problem:   Laryngitis, acute Active Problems:   Paralysis of left vocal cord   HFrEF (heart failure with reduced ejection fraction)   Breast cancer, right   Malignant neoplasm metastatic to bone  Resolved Problems:   * No resolved hospital problems. [facility] Course: Maria Monroe was admitted to the hospital with the working diagnosis of acute laryngitis.   52 yo female with the past medical history of breast cancer with bony metastasis, heart failure and vocal cord paralysis who presented with dyspnea. Reported 2 days of an upper respiratory tract infection with cough and nasal discharge for 2 days. The day of admission she became acutely dyspneic, and was having difficulty speaking. On her initial physical examination her blood pressure was 135/99, HR 83, RR 18 and 02 saturation 98%, lungs with no wheezing or rales, heart with S1 and S2 present and rhythmic, abdomen with no distention and no lower extremity edema.   Na 139, K 3,8 Cl 103, glucose 107, bun 7 cr 1,16  Wbc 8,0 hgb 12,9 plt 142   Neck radiograph with no prevertebral soft tissue swelling gas.   ENT was consulted.   Patient had flexible nasopharyngeal laryngoscopy.   This showed left vocal cord paralysis in the median position.  Right vocal cord was mobile.  There is some mild swelling which does note the airway.  No significant erythema.   ENT recommended patient stay overnight for systemic steroids.  Patient is already received 1 nebulized  epinephrine treatment.  04/16 improvement in her symptoms. Plan to continue with prednisone taper and follow up with ENT as outpatient.   Assessment and Plan: * Laryngitis, acute Patient was placed on systemic corticosteroids with improvement in her symptoms. Her dyspnea has improved and she is able to talk back to her baseline.  Her 02 saturation at the time of her discharge is 96% on room air.   Plan to continue with prednisone taper and follow up with ENT as outpatient.   Paralysis of left vocal cord Due to prior XRT to neck for her mets Her voice has recovered to baseline.   HFrEF (heart failure with reduced ejection fraction) No signs of acute decompensation, plan to resume entresto and carvedilol.   Breast cancer, right Getting active chemo at [facility].  Malignant neoplasm metastatic to bone Follow up with oncology as outpatient.          Consultants: ENT  Procedures performed: flexible nasopharyngeal laryngoscopy.   Disposition: Home Diet recommendation:  Discharge Diet Orders (From admission, onward)     Start     Ordered   12/16/22 0000  Diet - low sodium heart healthy        12/16/22 1433           Cardiac diet DISCHARGE MEDICATION: Allergies as of 12/16/2022       Reactions   Asa [aspirin] Hives   Tomato Hives, Itching, Swelling   Citric Acid Hives   Corn-containing Products Other (See Comments)   Told to avoid corn due to digestive  issues by oncologist    Relafen [nabumetone] Itching   Throat itching        Medication List     STOP taking these medications    ondansetron 4 MG tablet Commonly known as: ZOFRAN       TAKE these medications    carvedilol 25 MG tablet Commonly known as: COREG Take 25 mg by mouth 2 (two) times daily.   Entresto 24-26 MG Generic drug: sacubitril-valsartan Take 1 tablet by mouth 2 (two) times daily.   lidocaine 2 % solution Commonly known as: XYLOCAINE Use as directed 5 mLs in the mouth or throat 4  (four) times daily as needed for mouth pain.   loratadine 10 MG tablet Commonly known as: CLARITIN Take 10 mg by mouth daily as needed for allergies.   multivitamin tablet Take 1 tablet by mouth daily.   oxyCODONE 5 MG immediate release tablet Commonly known as: Oxy IR/ROXICODONE Take 5 mg by mouth every 4 (four) hours as needed for severe pain.   predniSONE 10 MG tablet Commonly known as: DELTASONE Take 4 tablets daily for three days, then take 2 tablets daily for three days, and then one tablet daily for 3 days, then half tablet daily for 2 days. Start taking on: December 17, 2022        Follow-up Information     Suzanna Obey, MD In 1 week.   Specialty: Otolaryngology Why: As needed Contact information: [address] [place] [address] [phone]         [facility] Emergency Department at [facility].   Specialty: Emergency Medicine Why: If symptoms worsen Contact information: [address] [phone] mc [place] [address] [phone]               Discharge Exam: There were no vitals filed for this visit. BP 102/78   Pulse 73   Temp 97.8 F (36.6 C)   Resp 16   SpO2 96%   Patient with no chest pain or dyspnea.   Neurology awake and alert ENT with no stridor, positive hoarse voice at her baseline  Cardiovascular with S1 and S2 present and rhythmic with no gallops, rubs or murmurs Respiratory with no rales or wheezing  Abdomen with no distention  No lower extremity edema   Condition at discharge: stable  The results of significant diagnostics from this hospitalization (including imaging, microbiology, ancillary and laboratory) are listed below for reference.   Imaging Studies: DG Neck Soft Tissue  Result Date: 12/15/2022 CLINICAL DATA:  Recent stridor that has resolved EXAM: NECK SOFT TISSUES - 2 VIEW COMPARISON:  None Available. FINDINGS: No prevertebral soft tissue swelling or gas. Preserved  epiglottis. No radiopaque foreign body. Jewelry obscures the C1-2 level on the lateral view. Degenerative changes seen of the cervical spine particularly along the facet joints as well as along the disc spaces along the mid cervical spine from C4 through C6. Central catheter seen at the edge of the imaging field along the thorax. Additional cross-sectional study as clinically appropriate IMPRESSION: Degenerative changes of the spine. No prevertebral soft tissue swelling gas. Electronically Signed   By: Karen Kays M.D.   On: 12/15/2022 16:16    Microbiology: Results for orders placed or performed during the hospital encounter of 06/10/20  Respiratory Panel by RT PCR (Flu A&B, Covid) - Nasopharyngeal Swab     Status: None   Collection Time: 06/11/20 12:17 AM   Specimen: Nasopharyngeal Swab  Result Value Ref  Range Status   SARS Coronavirus 2 by RT PCR NEGATIVE NEGATIVE Final    Comment: (NOTE) SARS-CoV-2 target nucleic acids are NOT DETECTED.  The SARS-CoV-2 RNA is generally detectable in upper respiratoy specimens during the acute phase of infection. The lowest concentration of SARS-CoV-2 viral copies this assay can detect is 131 copies/mL. A negative result does not preclude SARS-Cov-2 infection and should not be used as the sole basis for treatment or other patient management decisions. A negative result may occur with  improper specimen collection/handling, submission of specimen other than nasopharyngeal swab, presence of viral mutation(s) within the areas targeted by this assay, and inadequate number of viral copies (<131 copies/mL). A negative result must be combined with clinical observations, patient history, and epidemiological information. The expected result is Negative.  Fact Sheet for Patients:  https://www.moore.com/  Fact Sheet for Healthcare Providers:  https://www.young.biz/  This test is no t yet approved or cleared by the [place] FDA and  has been authorized for detection and/or diagnosis of SARS-CoV-2 by FDA under an Emergency Use Authorization (EUA). This EUA will remain  in effect (meaning this test can be used) for the duration of the COVID-19 declaration under Section 564(b)(1) of the Act, 21 U.S.C. section 360bbb-3(b)(1), unless the authorization is terminated or revoked sooner.     Influenza A by PCR NEGATIVE NEGATIVE Final   Influenza B by PCR NEGATIVE NEGATIVE Final    Comment: (NOTE) The Xpert Xpress SARS-CoV-2/FLU/RSV assay is intended as an aid in  the diagnosis of influenza from Nasopharyngeal swab specimens and  should not be used as a sole basis for treatment. Nasal washings and  aspirates are unacceptable for Xpert Xpress SARS-CoV-2/FLU/RSV  testing.  Fact Sheet for Patients: https://www.moore.com/  Fact Sheet for Healthcare Providers: https://www.young.biz/  This test is not yet approved or cleared by the [place] FDA and  has been authorized for detection and/or diagnosis of SARS-CoV-2 by  FDA under an Emergency Use Authorization (EUA). This EUA will remain  in effect (meaning this test can be used) for the duration of the  Covid-19 declaration under Section 564(b)(1) of the Act, 21  U.S.C. section 360bbb-3(b)(1), unless the authorization is  terminated or revoked. Performed at [facility], [address], [place], [place] [address]     Labs: CBC: Recent Labs  Lab 12/15/22 2001 12/15/22 2011  WBC 8.0  --   NEUTROABS 6.5  --   HGB 12.9 12.9  HCT 38.4 38.0  MCV 90.6  --   PLT 142*  --    Basic Metabolic Panel: Recent Labs  Lab 12/15/22 2011  NA 139  K 3.8  CL 103  GLUCOSE 102*  BUN 7  CREATININE 0.80   Liver Function Tests: No results for input(s): "AST", "ALT", "ALKPHOS", "BILITOT", "PROT", "ALBUMIN" in the last 168 hours. CBG: No results for input(s): "GLUCAP" in the last 168 hours.  Discharge time  spent: greater than 30 minutes.  Signed: Coralie Keens, MD [facility] 12/16/2022

## 2022-12-16 NOTE — Plan of Care (Signed)
  Problem: Education: Goal: Knowledge of General Education information will improve Description: Including pain rating scale, medication(s)/side effects and non-pharmacologic comfort measures 12/16/2022 1616 by Rosalio Macadamia, RN Outcome: Adequate for Discharge 12/16/2022 1435 by Rosalio Macadamia, RN Outcome: Progressing   Problem: Health Behavior/Discharge Planning: Goal: Ability to manage health-related needs will improve 12/16/2022 1616 by Rosalio Macadamia, RN Outcome: Adequate for Discharge 12/16/2022 1435 by Rosalio Macadamia, RN Outcome: Progressing   Problem: Clinical Measurements: Goal: Ability to maintain clinical measurements within normal limits will improve 12/16/2022 1616 by Rosalio Macadamia, RN Outcome: Adequate for Discharge 12/16/2022 1435 by Rosalio Macadamia, RN Outcome: Progressing Goal: Will remain free from infection 12/16/2022 1616 by Rosalio Macadamia, RN Outcome: Adequate for Discharge 12/16/2022 1435 by Rosalio Macadamia, RN Outcome: Progressing Goal: Diagnostic test results will improve 12/16/2022 1616 by Rosalio Macadamia, RN Outcome: Adequate for Discharge 12/16/2022 1435 by Rosalio Macadamia, RN Outcome: Progressing Goal: Respiratory complications will improve 12/16/2022 1616 by Rosalio Macadamia, RN Outcome: Adequate for Discharge 12/16/2022 1435 by Rosalio Macadamia, RN Outcome: Progressing Goal: Cardiovascular complication will be avoided 12/16/2022 1616 by Rosalio Macadamia, RN Outcome: Adequate for Discharge 12/16/2022 1435 by Rosalio Macadamia, RN Outcome: Progressing   Problem: Activity: Goal: Risk for activity intolerance will decrease 12/16/2022 1616 by Rosalio Macadamia, RN Outcome: Adequate for Discharge 12/16/2022 1435 by Rosalio Macadamia, RN Outcome: Progressing   Problem: Nutrition: Goal: Adequate nutrition will be maintained 12/16/2022 1616 by Rosalio Macadamia, RN Outcome: Adequate for Discharge 12/16/2022 1435 by Rosalio Macadamia, RN Outcome: Progressing    Problem: Coping: Goal: Level of anxiety will decrease 12/16/2022 1616 by Rosalio Macadamia, RN Outcome: Adequate for Discharge 12/16/2022 1435 by Rosalio Macadamia, RN Outcome: Progressing   Problem: Elimination: Goal: Will not experience complications related to bowel motility 12/16/2022 1616 by Rosalio Macadamia, RN Outcome: Adequate for Discharge 12/16/2022 1435 by Rosalio Macadamia, RN Outcome: Progressing Goal: Will not experience complications related to urinary retention 12/16/2022 1616 by Rosalio Macadamia, RN Outcome: Adequate for Discharge 12/16/2022 1435 by Rosalio Macadamia, RN Outcome: Progressing   Problem: Pain Managment: Goal: General experience of comfort will improve 12/16/2022 1616 by Rosalio Macadamia, RN Outcome: Adequate for Discharge 12/16/2022 1435 by Rosalio Macadamia, RN Outcome: Progressing   Problem: Safety: Goal: Ability to remain free from injury will improve 12/16/2022 1616 by Rosalio Macadamia, RN Outcome: Adequate for Discharge 12/16/2022 1435 by Rosalio Macadamia, RN Outcome: Progressing   Problem: Skin Integrity: Goal: Risk for impaired skin integrity will decrease 12/16/2022 1616 by Rosalio Macadamia, RN Outcome: Adequate for Discharge 12/16/2022 1435 by Rosalio Macadamia, RN Outcome: Progressing

## 2022-12-16 NOTE — Progress Notes (Signed)
Patient ID: Maria Monroe, female   DOB: 10/28/1970, 52 y.o.   MRN: [id number]  She is profoundly better. Her voice is almost back to baseline. She is breathing well. No dysphagia  She can be discharged with a taper of steroids. Come back with any worsening of the process. She can follow up as needed.

## 2022-12-16 NOTE — Plan of Care (Signed)

## 2022-12-16 NOTE — ED Notes (Signed)
Pt states that she feels much better reports that swelling has gone done and voice is less hoarse

## 2022-12-16 NOTE — Hospital Course (Signed)
Mrs. Krabbenhoft was admitted to the hospital with the working diagnosis of acute laryngitis.   52 yo female with the past medical history of breast cancer with bony metastasis, heart failure and vocal cord paralysis who presented with dyspnea. Reported 2 days of an upper respiratory tract infection with cough and nasal discharge for 2 days. The day of admission she became acutely dyspneic, and was having difficulty speaking. On her initial physical examination her blood pressure was 135/99, HR 83, RR 18 and 02 saturation 98%, lungs with no wheezing or rales, heart with S1 and S2 present and rhythmic, abdomen with no distention and no lower extremity edema.   Na 139, K 3,8 Cl 103, glucose 107, bun 7 cr 1,16  Wbc 8,0 hgb 12,9 plt 142   Neck radiograph with no prevertebral soft tissue swelling gas.   ENT was consulted.   Patient had flexible nasopharyngeal laryngoscopy.   This showed left vocal cord paralysis in the median position.  Right vocal cord was mobile.  There is some mild swelling which does note the airway.  No significant erythema.   ENT recommended patient stay overnight for systemic steroids.  Patient is already received 1 nebulized epinephrine treatment.  04/16 improvement in her symptoms. Plan to continue with prednisone taper and follow up with ENT as outpatient.

## 2022-12-16 NOTE — Progress Notes (Signed)
Admission Note:  Pt arrived to [address] [place] from the ED at approx. 1352. Pt is ambulatory/independent, alert and oriented x4, on room air, and denies pain. Pt continues to have hoarseness of voice, and painful swallowing however, pt has recently eaten a sandwich, without complication. Attending physician Erin Hearing, MD evaluated pt at bedside, and stated pt is stable to go home, also per ENT's note from today. Pt is in NAD at this time, with discharge order placed.

## 2022-12-16 NOTE — ED Notes (Signed)
Patient resting, no distress, all vitals stable.

## 2023-01-15 ENCOUNTER — Emergency Department (HOSPITAL_COMMUNITY): Payer: 59

## 2023-01-15 ENCOUNTER — Ambulatory Visit
Admission: EM | Admit: 2023-01-15 | Discharge: 2023-01-15 | Disposition: A | Discharge status: Home / Self Care | Payer: 59 | Attending: Family Medicine | Admitting: Family Medicine

## 2023-01-15 ENCOUNTER — Encounter (HOSPITAL_COMMUNITY): Payer: Self-pay

## 2023-01-15 ENCOUNTER — Inpatient Hospital Stay (HOSPITAL_COMMUNITY)
Admission: EM | Admit: 2023-01-15 | Discharge: 2023-01-17 | DRG: 871 | Disposition: A | Discharge status: Home / Self Care | Payer: 59 | Attending: Internal Medicine | Admitting: Internal Medicine

## 2023-01-15 DIAGNOSIS — I5020 Unspecified systolic (congestive) heart failure: Secondary | ICD-10-CM | POA: Diagnosis not present

## 2023-01-15 DIAGNOSIS — C50911 Malignant neoplasm of unspecified site of right female breast: Secondary | ICD-10-CM | POA: Diagnosis present

## 2023-01-15 DIAGNOSIS — Y842 Radiological procedure and radiotherapy as the cause of abnormal reaction of the patient, or of later complication, without mention of misadventure at the time of the procedure: Secondary | ICD-10-CM | POA: Diagnosis present

## 2023-01-15 DIAGNOSIS — Z79899 Other long term (current) drug therapy: Secondary | ICD-10-CM

## 2023-01-15 DIAGNOSIS — C7800 Secondary malignant neoplasm of unspecified lung: Secondary | ICD-10-CM | POA: Diagnosis present

## 2023-01-15 DIAGNOSIS — C50912 Malignant neoplasm of unspecified site of left female breast: Secondary | ICD-10-CM | POA: Diagnosis present

## 2023-01-15 DIAGNOSIS — I10 Essential (primary) hypertension: Secondary | ICD-10-CM | POA: Diagnosis not present

## 2023-01-15 DIAGNOSIS — R350 Frequency of micturition: Secondary | ICD-10-CM | POA: Diagnosis present

## 2023-01-15 DIAGNOSIS — Z7960 Long term (current) use of unspecified immunomodulators and immunosuppressants: Secondary | ICD-10-CM

## 2023-01-15 DIAGNOSIS — I110 Hypertensive heart disease with heart failure: Secondary | ICD-10-CM | POA: Diagnosis present

## 2023-01-15 DIAGNOSIS — Z90710 Acquired absence of both cervix and uterus: Secondary | ICD-10-CM | POA: Diagnosis not present

## 2023-01-15 DIAGNOSIS — J690 Pneumonitis due to inhalation of food and vomit: Secondary | ICD-10-CM | POA: Diagnosis present

## 2023-01-15 DIAGNOSIS — J3801 Paralysis of vocal cords and larynx, unilateral: Secondary | ICD-10-CM

## 2023-01-15 DIAGNOSIS — F32A Depression, unspecified: Secondary | ICD-10-CM | POA: Diagnosis present

## 2023-01-15 DIAGNOSIS — E871 Hypo-osmolality and hyponatremia: Secondary | ICD-10-CM | POA: Diagnosis present

## 2023-01-15 DIAGNOSIS — I5022 Chronic systolic (congestive) heart failure: Secondary | ICD-10-CM | POA: Diagnosis present

## 2023-01-15 DIAGNOSIS — R490 Dysphonia: Secondary | ICD-10-CM | POA: Diagnosis present

## 2023-01-15 DIAGNOSIS — Z886 Allergy status to analgesic agent status: Secondary | ICD-10-CM

## 2023-01-15 DIAGNOSIS — R3915 Urgency of urination: Secondary | ICD-10-CM | POA: Diagnosis present

## 2023-01-15 DIAGNOSIS — J189 Pneumonia, unspecified organism: Secondary | ICD-10-CM | POA: Diagnosis not present

## 2023-01-15 DIAGNOSIS — J45909 Unspecified asthma, uncomplicated: Secondary | ICD-10-CM | POA: Diagnosis present

## 2023-01-15 DIAGNOSIS — D630 Anemia in neoplastic disease: Secondary | ICD-10-CM | POA: Diagnosis present

## 2023-01-15 DIAGNOSIS — Z9013 Acquired absence of bilateral breasts and nipples: Secondary | ICD-10-CM

## 2023-01-15 DIAGNOSIS — Z91018 Allergy to other foods: Secondary | ICD-10-CM

## 2023-01-15 DIAGNOSIS — Z86718 Personal history of other venous thrombosis and embolism: Secondary | ICD-10-CM

## 2023-01-15 DIAGNOSIS — Z1152 Encounter for screening for COVID-19: Secondary | ICD-10-CM

## 2023-01-15 DIAGNOSIS — Z9102 Food additives allergy status: Secondary | ICD-10-CM

## 2023-01-15 DIAGNOSIS — E861 Hypovolemia: Secondary | ICD-10-CM | POA: Diagnosis present

## 2023-01-15 DIAGNOSIS — R509 Fever, unspecified: Secondary | ICD-10-CM

## 2023-01-15 DIAGNOSIS — E669 Obesity, unspecified: Secondary | ICD-10-CM | POA: Diagnosis present

## 2023-01-15 DIAGNOSIS — Z87891 Personal history of nicotine dependence: Secondary | ICD-10-CM | POA: Diagnosis not present

## 2023-01-15 DIAGNOSIS — E876 Hypokalemia: Secondary | ICD-10-CM

## 2023-01-15 DIAGNOSIS — C7951 Secondary malignant neoplasm of bone: Secondary | ICD-10-CM | POA: Diagnosis present

## 2023-01-15 DIAGNOSIS — A419 Sepsis, unspecified organism: Secondary | ICD-10-CM | POA: Diagnosis present

## 2023-01-15 DIAGNOSIS — Z6836 Body mass index (BMI) 36.0-36.9, adult: Secondary | ICD-10-CM

## 2023-01-15 DIAGNOSIS — Z888 Allergy status to other drugs, medicaments and biological substances status: Secondary | ICD-10-CM

## 2023-01-15 DIAGNOSIS — J3800 Paralysis of vocal cords and larynx, unspecified: Secondary | ICD-10-CM | POA: Insufficient documentation

## 2023-01-15 LAB — URINALYSIS, ROUTINE W REFLEX MICROSCOPIC
Bilirubin Urine: NEGATIVE
Glucose, UA: NEGATIVE mg/dL
Hgb urine dipstick: NEGATIVE
Ketones, ur: NEGATIVE mg/dL
Leukocytes,Ua: NEGATIVE
Nitrite: NEGATIVE
Protein, ur: NEGATIVE mg/dL
Specific Gravity, Urine: 1.003 — ABNORMAL LOW (ref 1.005–1.030)
pH: 7 (ref 5.0–8.0)

## 2023-01-15 LAB — CBC WITH DIFFERENTIAL/PLATELET
Abs Immature Granulocytes: 0.05 10*3/uL (ref 0.00–0.07)
Basophils Absolute: 0.1 10*3/uL (ref 0.0–0.1)
Basophils Relative: 0 %
Eosinophils Absolute: 0.1 10*3/uL (ref 0.0–0.5)
Eosinophils Relative: 1 %
HCT: 33.6 % — ABNORMAL LOW (ref 36.0–46.0)
Hemoglobin: 11.1 g/dL — ABNORMAL LOW (ref 12.0–15.0)
Immature Granulocytes: 0 %
Lymphocytes Relative: 27 %
Lymphs Abs: 3.6 10*3/uL (ref 0.7–4.0)
MCH: 30.2 pg (ref 26.0–34.0)
MCHC: 33 g/dL (ref 30.0–36.0)
MCV: 91.6 fL (ref 80.0–100.0)
Monocytes Absolute: 1.4 10*3/uL — ABNORMAL HIGH (ref 0.1–1.0)
Monocytes Relative: 10 %
Neutro Abs: 8.4 10*3/uL — ABNORMAL HIGH (ref 1.7–7.7)
Neutrophils Relative %: 62 %
Platelets: 276 10*3/uL (ref 150–400)
RBC: 3.67 MIL/uL — ABNORMAL LOW (ref 3.87–5.11)
RDW: 13.7 % (ref 11.5–15.5)
WBC: 13.6 10*3/uL — ABNORMAL HIGH (ref 4.0–10.5)
nRBC: 0 % (ref 0.0–0.2)

## 2023-01-15 LAB — COMPREHENSIVE METABOLIC PANEL
ALT: 28 U/L (ref 0–44)
AST: 71 U/L — ABNORMAL HIGH (ref 15–41)
Albumin: 3 g/dL — ABNORMAL LOW (ref 3.5–5.0)
Alkaline Phosphatase: 62 U/L (ref 38–126)
Anion gap: 9 (ref 5–15)
BUN: 6 mg/dL (ref 6–20)
CO2: 21 mmol/L — ABNORMAL LOW (ref 22–32)
Calcium: 8.4 mg/dL — ABNORMAL LOW (ref 8.9–10.3)
Chloride: 98 mmol/L (ref 98–111)
Creatinine, Ser: 0.82 mg/dL (ref 0.44–1.00)
GFR, Estimated: >60 mL/min (ref >60)
Glucose, Bld: 94 mg/dL (ref 70–99)
Potassium: 3.4 mmol/L — ABNORMAL LOW (ref 3.5–5.1)
Sodium: 128 mmol/L — ABNORMAL LOW (ref 135–145)
Total Bilirubin: 1.1 mg/dL (ref 0.3–1.2)
Total Protein: 8.8 g/dL — ABNORMAL HIGH (ref 6.5–8.1)

## 2023-01-15 LAB — POCT URINALYSIS DIP (MANUAL ENTRY)
Bilirubin, UA: NEGATIVE
Blood, UA: NEGATIVE
Glucose, UA: NEGATIVE mg/dL
Ketones, POC UA: NEGATIVE mg/dL
Leukocytes, UA: NEGATIVE
Nitrite, UA: NEGATIVE
Protein Ur, POC: NEGATIVE mg/dL
Spec Grav, UA: <=1.005 — AB (ref 1.010–1.025)
Urobilinogen, UA: 1 E.U./dL
pH, UA: 7 (ref 5.0–8.0)

## 2023-01-15 LAB — APTT
aPTT: >200 seconds (ref 24–36)
aPTT: 36 seconds (ref 24–36)

## 2023-01-15 LAB — RESP PANEL BY RT-PCR (RSV, FLU A&B, COVID)  RVPGX2
Influenza A by PCR: NEGATIVE
Influenza B by PCR: NEGATIVE
Resp Syncytial Virus by PCR: NEGATIVE
SARS Coronavirus 2 by RT PCR: NEGATIVE

## 2023-01-15 LAB — LACTIC ACID, PLASMA: Lactic Acid, Venous: 1.2 mmol/L (ref 0.5–1.9)

## 2023-01-15 LAB — PROTIME-INR
INR: 1.2 (ref 0.8–1.2)
Prothrombin Time: 15.2 seconds (ref 11.4–15.2)

## 2023-01-15 MED ORDER — LACTATED RINGERS IV BOLUS (SEPSIS)
1000.0000 mL | Freq: Once | INTRAVENOUS | Status: AC
  Administered 2023-01-15: 1000 mL via INTRAVENOUS

## 2023-01-15 MED ORDER — METRONIDAZOLE 500 MG/100ML IV SOLN
500.0000 mg | Freq: Once | INTRAVENOUS | Status: AC
  Administered 2023-01-15: 500 mg via INTRAVENOUS
  Filled 2023-01-15: qty 100

## 2023-01-15 MED ORDER — CARVEDILOL 25 MG PO TABS
25.0000 mg | ORAL_TABLET | Freq: Two times a day (BID) | ORAL | Status: DC
  Administered 2023-01-15 – 2023-01-17 (×4): 25 mg via ORAL
  Filled 2023-01-15 (×2): qty 1
  Filled 2023-01-15: qty 2
  Filled 2023-01-15: qty 1

## 2023-01-15 MED ORDER — LACTATED RINGERS IV SOLN: INTRAVENOUS | Status: DC

## 2023-01-15 MED ORDER — AZELASTINE HCL 0.1 % NA SOLN
2.0000 | Freq: Four times a day (QID) | NASAL | Status: DC | PRN
  Administered 2023-01-16: 2 via NASAL
  Filled 2023-01-15: qty 30

## 2023-01-15 MED ORDER — SODIUM CHLORIDE 0.9 % IV BOLUS
1000.0000 mL | Freq: Once | INTRAVENOUS | Status: AC
  Administered 2023-01-15: 1000 mL via INTRAVENOUS

## 2023-01-15 MED ORDER — OXYCODONE HCL 5 MG PO TABS: 5.0000 mg | ORAL_TABLET | ORAL | Status: DC | PRN

## 2023-01-15 MED ORDER — VANCOMYCIN HCL IN DEXTROSE 1-5 GM/200ML-% IV SOLN: 1000.0000 mg | Freq: Once | INTRAVENOUS | Status: DC

## 2023-01-15 MED ORDER — LACTATED RINGERS IV BOLUS (SEPSIS): 500.0000 mL | Freq: Once | INTRAVENOUS | Status: DC

## 2023-01-15 MED ORDER — VANCOMYCIN HCL 1500 MG/300ML IV SOLN
1500.0000 mg | Freq: Once | INTRAVENOUS | Status: AC
  Administered 2023-01-15: 1500 mg via INTRAVENOUS
  Filled 2023-01-15: qty 300

## 2023-01-15 MED ORDER — SODIUM CHLORIDE 0.9 % IV SOLN
2.0000 g | Freq: Once | INTRAVENOUS | Status: AC
  Administered 2023-01-15: 2 g via INTRAVENOUS
  Filled 2023-01-15: qty 12.5

## 2023-01-15 MED ORDER — IOHEXOL 300 MG/ML  SOLN
100.0000 mL | Freq: Once | INTRAMUSCULAR | Status: AC | PRN
  Administered 2023-01-15: 100 mL via INTRAVENOUS

## 2023-01-15 MED ORDER — SACUBITRIL-VALSARTAN 24-26 MG PO TABS
1.0000 | ORAL_TABLET | Freq: Two times a day (BID) | ORAL | Status: DC
  Administered 2023-01-15 – 2023-01-17 (×4): 1 via ORAL
  Filled 2023-01-15 (×4): qty 1

## 2023-01-15 MED ORDER — ACETAMINOPHEN 325 MG PO TABS
650.0000 mg | ORAL_TABLET | Freq: Four times a day (QID) | ORAL | Status: DC | PRN
  Administered 2023-01-16: 650 mg via ORAL
  Filled 2023-01-15: qty 2

## 2023-01-15 MED ORDER — SODIUM CHLORIDE 0.9 % IV SOLN
3.0000 g | Freq: Four times a day (QID) | INTRAVENOUS | Status: DC
  Administered 2023-01-16 – 2023-01-17 (×6): 3 g via INTRAVENOUS
  Filled 2023-01-15 (×7): qty 8

## 2023-01-15 MED ORDER — POTASSIUM CHLORIDE CRYS ER 20 MEQ PO TBCR
40.0000 meq | EXTENDED_RELEASE_TABLET | Freq: Once | ORAL | Status: AC
  Administered 2023-01-16: 40 meq via ORAL
  Filled 2023-01-15: qty 2

## 2023-01-15 MED ORDER — ENOXAPARIN SODIUM 40 MG/0.4ML IJ SOSY
40.0000 mg | PREFILLED_SYRINGE | INTRAMUSCULAR | Status: DC
  Administered 2023-01-16 – 2023-01-17 (×2): 40 mg via SUBCUTANEOUS
  Filled 2023-01-15 (×2): qty 0.4

## 2023-01-15 MED ORDER — LACTATED RINGERS IV BOLUS (SEPSIS): 1000.0000 mL | Freq: Once | INTRAVENOUS | Status: DC

## 2023-01-15 MED ORDER — ACETAMINOPHEN 500 MG PO TABS
1000.0000 mg | ORAL_TABLET | Freq: Once | ORAL | Status: AC
  Administered 2023-01-15: 1000 mg via ORAL
  Filled 2023-01-15: qty 2

## 2023-01-15 NOTE — Assessment & Plan Note (Signed)
-  due to hx of radiation for breast cancer -follows with [facility] and has speech therapy planned for June -will have speech complete swallowing evaluation here for suspected aspiration pneumonia

## 2023-01-15 NOTE — ED Notes (Signed)
Patient is being discharged from the Urgent Care and sent to the Emergency Department via private vehicle . Per Dr. Marlinda Mike, patient is in need of higher level of care due to chemo pt on chemo. Patient is aware and verbalizes understanding of plan of care.  Vitals:   01/15/23 1603  BP: (!) 147/91  Pulse: (!) 107  Resp: 16  Temp: (!) 102.9 F (39.4 C)  SpO2: 98%

## 2023-01-15 NOTE — ED Provider Notes (Signed)
[name]    CSN: [id number] Arrival date & time: 01/15/23  1443      History   Chief Complaint Chief Complaint  Patient presents with   Urinary Frequency    HPI Maria Monroe is a 52 y.o. female.    Urinary Frequency   Here for having dark urine and lower abdominal pressure.  She arrives to urgent care, and is found to have a temperature of 102.9.  It is difficult to get a clear history, but she states she has had some cough that improves every time she "gets out some sputum".  She shows me as a summary from a visit in mid April where she received prednisone, and "that gave me yeast"  I cannot tell that she is having dysuria.  She last had chemo about 2 weeks ago.  Nursing staff asked her to call her oncology office during triage and she is waiting for them to call her back  Past Medical History:  Diagnosis Date   Arthritis    Asthma    Bone metastasis    Cancer (HCC)    overlapping sites of right breast   DVT (deep venous thrombosis) (HCC)    H/O bilateral mastectomy    HER2-positive carcinoma of right breast (HCC)    Hypertension    Lymphedema of both arms     Patient Active Problem List   Diagnosis Date Noted   Laryngitis, acute 12/15/2022   Paralysis of left vocal cord 12/15/2022   HFrEF (heart failure with reduced ejection fraction) (HCC) 12/15/2022   Syncope 04/14/2022   Anxiety 06/10/2020   Breast cancer, right (HCC) 06/10/2020   HER2-positive carcinoma of right breast (HCC) 06/10/2020   Obesity (BMI 30.0-34.9) 06/10/2020   Malignant neoplasm metastatic to bone (HCC) 10/26/2016    Past Surgical History:  Procedure Laterality Date   ABDOMINAL HYSTERECTOMY     BRONCHOSCOPY     x3   CESAREAN SECTION     x2   COLONOSCOPY N/A 02/26/2022   Procedure: COLONOSCOPY;  Surgeon: Toledo, Boykin Nearing, MD;  Location: [facility] ENDOSCOPY;  Service: Gastroenterology;  Laterality: N/A;   MASTECTOMY     PORTA CATH INSERTION Left    TUBAL  LIGATION      OB History   No obstetric history on file.      Home Medications    Prior to Admission medications   Medication Sig Start Date End Date Taking? Authorizing Provider  carvedilol (COREG) 25 MG tablet Take 25 mg by mouth 2 (two) times daily. 12/23/21   [provider]  lidocaine (XYLOCAINE) 2 % solution Use as directed 5 mLs in the mouth or throat 4 (four) times daily as needed for mouth pain.    [provider]  loratadine (CLARITIN) 10 MG tablet Take 10 mg by mouth daily as needed for allergies.    [provider]  Multiple Vitamin (MULTIVITAMIN) tablet Take 1 tablet by mouth daily.    [provider]  oxyCODONE (OXY IR/ROXICODONE) 5 MG immediate release tablet Take 5 mg by mouth every 4 (four) hours as needed for severe pain.    [provider]  predniSONE (DELTASONE) 10 MG tablet Take 4 tablets daily for three days, then take 2 tablets daily for three days, and then one tablet daily for 3 days, then half tablet daily for 2 days. 12/17/22   Arrien, York Ram, MD  sacubitril-valsartan (ENTRESTO) 24-26 MG Take 1 tablet by mouth 2 (two) times daily.  [provider]    Family History History reviewed. No pertinent family history.  Social History Social History   Tobacco Use   Smoking status: Former    Packs/day: 0.50    Years: 8.00    Additional pack years: 0.00    Total pack years: 4.00    Types: Cigarettes    Quit date: 10/02/2010    Years since quitting: 12.2   Smokeless tobacco: Never  Vaping Use   Vaping Use: Never used  Substance Use Topics   Alcohol use: Never   Drug use: Never     Allergies   Asa [aspirin], Tomato, Citric acid, Corn-containing products, and Relafen [nabumetone]   Review of Systems Review of Systems  Genitourinary:  Positive for frequency.     Physical Exam Triage Vital Signs ED Triage Vitals  Enc Vitals Group     BP 01/15/23 1603 (!) 147/91     Pulse Rate 01/15/23  1603 (!) 107     Resp 01/15/23 1603 16     Temp 01/15/23 1603 (!) 102.9 F (39.4 C)     Temp Source 01/15/23 1603 Oral     SpO2 01/15/23 1603 98 %     Weight --      Height --      Head Circumference --      Peak Flow --      Pain Score 01/15/23 1604 2     Pain Loc --      Pain Edu? --      Excl. in GC? --    No data found.  Updated Vital Signs BP (!) 147/91 (BP Location: Left Arm)   Pulse (!) 107   Temp (!) 102.9 F (39.4 C) (Oral)   Resp 16   SpO2 98%   Visual Acuity Right Eye Distance:   Left Eye Distance:   Bilateral Distance:    Right Eye Near:   Left Eye Near:    Bilateral Near:     Physical Exam Vitals reviewed.  Constitutional:      General: She is not in acute distress.    Appearance: She is not ill-appearing, toxic-appearing or diaphoretic.  Cardiovascular:     Rate and Rhythm: Regular rhythm. Tachycardia present.  Pulmonary:     Effort: Pulmonary effort is normal. No respiratory distress.     Breath sounds: Normal breath sounds. No stridor. No wheezing, rhonchi or rales.  Skin:    Coloration: Skin is not jaundiced or pale.  Neurological:     Mental Status: She is alert and oriented to person, place, and time.  Psychiatric:        Behavior: Behavior normal.      UC Treatments / Results  Labs (all labs ordered are listed, but only abnormal results are displayed) Labs Reviewed  POCT URINALYSIS DIP (MANUAL ENTRY) - Abnormal; Notable for the following components:      Result Value   Color, UA yellow (*)    Spec Grav, UA <=1.005 (*)    All other components within normal limits    EKG   Radiology No results found.  Procedures Procedures (including critical care time)  Medications Ordered in UC Medications - No data to display  Initial Impression / Assessment and Plan / UC Course  I have reviewed the triage vital signs and the nursing notes.  Pertinent labs & imaging results that were available during my care of the patient were  reviewed by me and considered in my medical decision making (see  chart for details).        Her urinalysis is clear, without nitrites, white cells, or red cells.  With her having had chemo 2 weeks ago and this fever, I am asking her to please present to the emergency room for higher level of evaluation and treatment that we can provide here in the urgent care setting.  I think she needs at least an urgent CBC done.  When patient called her oncology office, she was told that the team would have to call her back on recommendations on whether or not she should take any Tylenol and that she should possibly have her temperature rechecked.  Of note my team had checked her temperature twice and it was high.  Final Clinical Impressions(s) / UC Diagnoses   Final diagnoses:  Fever, unspecified fever cause     Discharge Instructions      Patient will proceed to the emergency room for further evaluation.     ED Prescriptions   None    PDMP not reviewed this encounter.   Zenia Resides, MD 01/15/23 [phone]

## 2023-01-15 NOTE — ED Triage Notes (Signed)
Pt states she noticed her urine is light yellow and is having lower abdominal pressure. Pt has a fever is currently a cancer patient as is getting chemo.  Last chemo was 2 weeks ago.

## 2023-01-15 NOTE — Assessment & Plan Note (Signed)
-  hypovolemic hyponatremia -follow tomorrow after IV fluid hydration

## 2023-01-15 NOTE — Discharge Instructions (Signed)
Patient will proceed to the emergency room for further evaluation.

## 2023-01-15 NOTE — ED Notes (Signed)
ED TO INPATIENT HANDOFF REPORT  ED Nurse Name and Phone #:  Alonna Buckler RN [phone]  S Name/Age/Gender Maria Monroe 52 y.o. female Room/Bed: WA12/WA12  Code Status   Code Status: Prior  Home/SNF/Other Home Patient oriented to: self, place, time, and situation Is this baseline? Yes   Triage Complete: Triage complete  Chief Complaint Sepsis [facility]) [A41.9]  Triage Note Pt presents with c/o fever. Pt went to UC today for some lower abdominal pressure and was noted to have a fever. Pt is a cancer pt, last chemo treatment approx 2 weeks ago.   Allergies Allergies  Allergen Reactions   Asa [Aspirin] Hives   Tomato Hives, Itching and Swelling   Citric Acid Hives   Corn Oil Nausea And Vomiting   Corn-Containing Products Nausea And Vomiting and Other (See Comments)    Told to avoid corn due to digestive issues by oncologist    Prednisone Other (See Comments)    Caused thrush and yeast issues   Relafen [Nabumetone] Itching and Other (See Comments)    Throat itching    Level of Care/Admitting Diagnosis ED Disposition     ED Disposition  Admit   Condition  --   Comment  Hospital Area: [facility] [[id number]]  Level of Care: Telemetry [5]  Admit to tele based on following criteria: Other see comments  Comments: rate  May admit patient to [facility] or [facility] if equivalent level of care is available:: No  Covid Evaluation: Asymptomatic - no recent exposure (last 10 days) testing not required  Diagnosis: Sepsis [facility]) [[id number]]  Admitting Physician: Anselm Jungling [[id number]]  Attending Physician: Anselm Jungling [[id number]]  Certification:: I certify this patient will need inpatient services for at least 2 midnights  Estimated Length of Stay: 2          B Medical/Surgery History Past Medical History:  Diagnosis Date   Arthritis    Asthma    Bone metastasis    Cancer (HCC)    overlapping sites of right breast   DVT (deep venous thrombosis)  (HCC)    H/O bilateral mastectomy    HER2-positive carcinoma of right breast (HCC)    Hypertension    Lymphedema of both arms    Past Surgical History:  Procedure Laterality Date   ABDOMINAL HYSTERECTOMY     BRONCHOSCOPY     x3   CESAREAN SECTION     x2   COLONOSCOPY N/A 02/26/2022   Procedure: COLONOSCOPY;  Surgeon: Toledo, Boykin Nearing, MD;  Location: [facility] ENDOSCOPY;  Service: Gastroenterology;  Laterality: N/A;   MASTECTOMY     PORTA CATH INSERTION Left    TUBAL LIGATION       A IV Location/Drains/Wounds Patient Lines/Drains/Airways Status     Active Line/Drains/Airways     Name Placement date Placement time Site Days   Implanted Port 06/10/20 Left Chest 06/10/20  1823  Chest  949   Peripheral IV 01/15/23 20 G Anterior;Left;Proximal Forearm 01/15/23  1911  Forearm  less than 1            Intake/Output Last 24 hours No intake or output data in the 24 hours ending 01/15/23 2110  Labs/Imaging Results for orders placed or performed during the hospital encounter of 01/15/23 (from the past 48 hour(s))  Resp panel by RT-PCR (RSV, Flu A&B, Covid) Anterior Nasal Swab     Status: None   Collection Time: 01/15/23  6:59 PM   Specimen: Anterior Nasal Swab  Result  Value Ref Range   SARS Coronavirus 2 by RT PCR NEGATIVE NEGATIVE    Comment: (NOTE) SARS-CoV-2 target nucleic acids are NOT DETECTED.  The SARS-CoV-2 RNA is generally detectable in upper respiratory specimens during the acute phase of infection. The lowest concentration of SARS-CoV-2 viral copies this assay can detect is 138 copies/mL. A negative result does not preclude SARS-Cov-2 infection and should not be used as the sole basis for treatment or other patient management decisions. A negative result may occur with  improper specimen collection/handling, submission of specimen other than nasopharyngeal swab, presence of viral mutation(s) within the areas targeted by this assay, and inadequate number of  viral copies(<138 copies/mL). A negative result must be combined with clinical observations, patient history, and epidemiological information. The expected result is Negative.  Fact Sheet for Patients:  BloggerCourse.com  Fact Sheet for Healthcare Providers:  SeriousBroker.it  This test is no t yet approved or cleared by the [place] FDA and  has been authorized for detection and/or diagnosis of SARS-CoV-2 by FDA under an Emergency Use Authorization (EUA). This EUA will remain  in effect (meaning this test can be used) for the duration of the COVID-19 declaration under Section 564(b)(1) of the Act, 21 U.S.C.section 360bbb-3(b)(1), unless the authorization is terminated  or revoked sooner.       Influenza A by PCR NEGATIVE NEGATIVE   Influenza B by PCR NEGATIVE NEGATIVE    Comment: (NOTE) The Xpert Xpress SARS-CoV-2/FLU/RSV plus assay is intended as an aid in the diagnosis of influenza from Nasopharyngeal swab specimens and should not be used as a sole basis for treatment. Nasal washings and aspirates are unacceptable for Xpert Xpress SARS-CoV-2/FLU/RSV testing.  Fact Sheet for Patients: BloggerCourse.com  Fact Sheet for Healthcare Providers: SeriousBroker.it  This test is not yet approved or cleared by the [place] FDA and has been authorized for detection and/or diagnosis of SARS-CoV-2 by FDA under an Emergency Use Authorization (EUA). This EUA will remain in effect (meaning this test can be used) for the duration of the COVID-19 declaration under Section 564(b)(1) of the Act, 21 U.S.C. section 360bbb-3(b)(1), unless the authorization is terminated or revoked.     Resp Syncytial Virus by PCR NEGATIVE NEGATIVE    Comment: (NOTE) Fact Sheet for Patients: BloggerCourse.com  Fact Sheet for Healthcare  Providers: SeriousBroker.it  This test is not yet approved or cleared by the [place] FDA and has been authorized for detection and/or diagnosis of SARS-CoV-2 by FDA under an Emergency Use Authorization (EUA). This EUA will remain in effect (meaning this test can be used) for the duration of the COVID-19 declaration under Section 564(b)(1) of the Act, 21 U.S.C. section 360bbb-3(b)(1), unless the authorization is terminated or revoked.  Performed at [facility], [address], [place], [place] [address]   Lactic acid, plasma     Status: None   Collection Time: 01/15/23  7:00 PM  Result Value Ref Range   Lactic Acid, Venous 1.2 0.5 - 1.9 mmol/L    Comment: Performed at [facility], [address] [place], [place] [address]  Comprehensive metabolic panel     Status: Abnormal   Collection Time: 01/15/23  7:00 PM  Result Value Ref Range   Sodium 128 (L) 135 - 145 mmol/L   Potassium 3.4 (L) 3.5 - 5.1 mmol/L   Chloride 98 98 - 111 mmol/L   CO2 21 (L) 22 - 32 mmol/L   Glucose, Bld 94 70 - 99 mg/dL  Comment: Glucose reference range applies only to samples taken after fasting for at least 8 hours.   BUN 6 6 - 20 mg/dL   Creatinine, Ser 6.04 0.44 - 1.00 mg/dL   Calcium 8.4 (L) 8.9 - 10.3 mg/dL   Total Protein 8.8 (H) 6.5 - 8.1 g/dL   Albumin 3.0 (L) 3.5 - 5.0 g/dL   AST 71 (H) 15 - 41 U/L   ALT 28 0 - 44 U/L   Alkaline Phosphatase 62 38 - 126 U/L   Total Bilirubin 1.1 0.3 - 1.2 mg/dL   GFR, Estimated >54 >09 mL/min    Comment: (NOTE) Calculated using the CKD-EPI Creatinine Equation (2021)    Anion gap 9 5 - 15    Comment: Performed at [facility], [address], [place], [place] [address]  CBC with Differential     Status: Abnormal   Collection Time: 01/15/23  7:00 PM  Result Value Ref Range   WBC 13.6 (H) 4.0 - 10.5 K/uL   RBC 3.67 (L) 3.87 - 5.11 MIL/uL   Hemoglobin 11.1 (L) 12.0 -  15.0 g/dL   HCT 47.8 (L) 29.5 - 62.1 %   MCV 91.6 80.0 - 100.0 fL   MCH 30.2 26.0 - 34.0 pg   MCHC 33.0 30.0 - 36.0 g/dL   RDW 30.8 65.7 - 84.6 %   Platelets 276 150 - 400 K/uL   nRBC 0.0 0.0 - 0.2 %   Neutrophils Relative % 62 %   Neutro Abs 8.4 (H) 1.7 - 7.7 K/uL   Lymphocytes Relative 27 %   Lymphs Abs 3.6 0.7 - 4.0 K/uL   Monocytes Relative 10 %   Monocytes Absolute 1.4 (H) 0.1 - 1.0 K/uL   Eosinophils Relative 1 %   Eosinophils Absolute 0.1 0.0 - 0.5 K/uL   Basophils Relative 0 %   Basophils Absolute 0.1 0.0 - 0.1 K/uL   Immature Granulocytes 0 %   Abs Immature Granulocytes 0.05 0.00 - 0.07 K/uL    Comment: Performed at [facility], [address], [place], [place] [address]  Urinalysis, Routine w reflex microscopic -Urine, Clean Catch     Status: Abnormal   Collection Time: 01/15/23  7:00 PM  Result Value Ref Range   Color, Urine YELLOW YELLOW   APPearance CLEAR CLEAR   Specific Gravity, Urine 1.003 (L) 1.005 - 1.030   pH 7.0 5.0 - 8.0   Glucose, UA NEGATIVE NEGATIVE mg/dL   Hgb urine dipstick NEGATIVE NEGATIVE   Bilirubin Urine NEGATIVE NEGATIVE   Ketones, ur NEGATIVE NEGATIVE mg/dL   Protein, ur NEGATIVE NEGATIVE mg/dL   Nitrite NEGATIVE NEGATIVE   Leukocytes,Ua NEGATIVE NEGATIVE    Comment: Performed at [facility], [address], [place], [place] [address]  Protime-INR     Status: None   Collection Time: 01/15/23  7:00 PM  Result Value Ref Range   Prothrombin Time 15.2 11.4 - 15.2 seconds   INR 1.2 0.8 - 1.2    Comment: (NOTE) INR goal varies based on device and disease states. Performed at [facility], [address], [place], [place] [address]   APTT     Status: Abnormal   Collection Time: 01/15/23  7:00 PM  Result Value Ref Range   aPTT >200 (HH) 24 - 36 seconds    Comment:        IF BASELINE aPTT IS ELEVATED, SUGGEST PATIENT RISK ASSESSMENT BE USED TO DETERMINE APPROPRIATE ANTICOAGULANT  THERAPY.  CRITICAL RESULT CALLED TO, READ BACK BY AND VERIFIED WITH: B. Genora Arp RN Performed at [facility], [address] [place], [place] [address]   APTT     Status: None   Collection Time: 01/15/23  8:41 PM  Result Value Ref Range   aPTT 36 24 - 36 seconds    Comment: Performed at [facility], [address] [place], [place] [address]   CT ABDOMEN PELVIS W CONTRAST  Result Date: 01/15/2023 CLINICAL DATA:  Abdominal pain, acute, nonlocalized EXAM: CT ABDOMEN AND PELVIS WITH CONTRAST TECHNIQUE: Multidetector CT imaging of the abdomen and pelvis was performed using the standard protocol following bolus administration of intravenous contrast. RADIATION DOSE REDUCTION: This exam was performed according to the departmental dose-optimization program which includes automated exposure control, adjustment of the mA and/or kV according to patient size and/or use of iterative reconstruction technique. CONTRAST:  OMNIPAQUE IOHEXOL 300 MG/ML  SOLN COMPARISON:  None Available. FINDINGS: Lower chest: There is multifocal consolidation within the basilar right middle lobe and posterior basal segment of the right lower lobe. Within the posterior basal right lower lobe there is associated bronchial wall thickening and extensive airway impaction. Together, the findings are in keeping with multifocal infection or aspiration. No pleural effusion. Cardiac size within normal limits. Hepatobiliary: Mild hepatic steatosis. No enhancing intrahepatic mass. No intra or extrahepatic biliary ductal dilation. Gallbladder unremarkable. Pancreas: Unremarkable Spleen: Unremarkable Adrenals/Urinary Tract: Adrenal glands are unremarkable. Kidneys are normal, without renal calculi, focal lesion, or hydronephrosis. Bladder is unremarkable. Stomach/Bowel: Mild to moderate pancolonic diverticulosis, most severe within the hepatic flexure. The stomach, small bowel, and large bowel are  otherwise unremarkable. No evidence of obstruction or focal inflammation. Appendix normal. No free intraperitoneal gas or fluid. Vascular/Lymphatic: No significant vascular findings are present. No enlarged abdominal or pelvic lymph nodes. Reproductive: Status post hysterectomy. No adnexal masses. Other: No abdominal wall hernia or abnormality. No abdominopelvic ascites. Musculoskeletal: No acute bone abnormality. No lytic or blastic bone lesion. IMPRESSION: 1. No acute intra-abdominal pathology identified. 2. Multifocal consolidation within the basilar right middle lobe and posterior basal right lower lobe with associated bronchial wall thickening and extensive airway impaction. In the acute setting, the findings are in keeping with multifocal infection or aspiration. 3. Mild hepatic steatosis. 4. Mild to moderate pancolonic diverticulosis without superimposed acute inflammatory change. Electronically Signed   By: Helyn Numbers M.D.   On: 01/15/2023 20:32   DG Chest 2 View  Result Date: 01/15/2023 CLINICAL DATA:  Fever with abdominal pressure.  Breast cancer. EXAM: CHEST - 2 VIEW COMPARISON:  Radiographs 04/13/2022 and 06/08/2020. Chest CT 04/13/2022. FINDINGS: Left IJ Port-A-Cath extends to the level of the superior cavoatrial junction, unchanged. Grossly stable right perihilar opacity compared with prior radiographs and CT, most likely due to sequela of prior radiation therapy. No evidence of superimposed airspace disease, edema, pleural effusion or pneumothorax. There are postsurgical changes in the right breast and right axilla. Previously demonstrated sclerotic metastases within the cervicothoracic spine are not well visualized. No acute osseous findings are seen. IMPRESSION: No evidence of acute cardiopulmonary process. Grossly stable right perihilar opacity, most likely due to sequela of prior radiation therapy. Electronically Signed   By: Carey Bullocks M.D.   On: 01/15/2023 17:56    Pending  Labs Unresulted Labs (From admission, onward)     Start     Ordered   01/15/23 1814  Blood Culture (routine x 2)  (Septic presentation on arrival (screening labs, nursing  and treatment orders for obvious sepsis))  BLOOD CULTURE X 2,   STAT      01/15/23 1814   01/15/23 1724  Lactic acid, plasma  Now then every 2 hours,   R      01/15/23 1724            Vitals/Pain Today's Vitals   01/15/23 1655 01/15/23 1657 01/15/23 1926 01/15/23 2100  BP: (!) 124/111  110/66 137/88  Pulse: (!) 110  (!) 102 95  Resp: 18  19 (!) 21  Temp: (!) 102.5 F (39.2 C)  98.9 F (37.2 C)   TempSrc: Oral  Oral   SpO2: 98%  96% 98%  PainSc:  0-No pain      Isolation Precautions No active isolations  Medications Medications  lactated ringers infusion (has no administration in time range)  lactated ringers bolus 1,000 mL (1,000 mLs Intravenous New Bag/Given 01/15/23 1917)    And  lactated ringers bolus 1,000 mL (1,000 mLs Intravenous New Bag/Given 01/15/23 1912)    And  lactated ringers bolus 1,000 mL (has no administration in time range)    And  lactated ringers bolus 500 mL (has no administration in time range)  vancomycin (VANCOREADY) IVPB 1500 mg/300 mL (1,500 mg Intravenous New Bag/Given 01/15/23 2005)  carvedilol (COREG) tablet 25 mg (has no administration in time range)  sacubitril-valsartan (ENTRESTO) 24-26 mg per tablet (has no administration in time range)  acetaminophen (TYLENOL) tablet 1,000 mg (1,000 mg Oral Given 01/15/23 1825)  sodium chloride 0.9 % bolus 1,000 mL (1,000 mLs Intravenous New Bag/Given 01/15/23 1917)  ceFEPIme (MAXIPIME) 2 g in sodium chloride 0.9 % 100 mL IVPB (2 g Intravenous New Bag/Given 01/15/23 1915)  metroNIDAZOLE (FLAGYL) IVPB 500 mg (500 mg Intravenous New Bag/Given 01/15/23 2007)  iohexol (OMNIPAQUE) 300 MG/ML solution 100 mL (100 mLs Intravenous Contrast Given 01/15/23 2013)    Mobility walks     Focused Assessments Pulmonary Assessment Handoff:  Lung  sounds:   O2 Device: Room Air      R Recommendations: See Admitting Provider Note  Report given to:   Additional Notes:

## 2023-01-15 NOTE — Assessment & Plan Note (Signed)
-  controlled -continue beta-blocker and Entresto

## 2023-01-15 NOTE — Sepsis Progress Note (Signed)
Elink monitoring for the code sepsis protocol.  

## 2023-01-15 NOTE — ED Triage Notes (Signed)
Pt presents with c/o fever. Pt went to UC today for some lower abdominal pressure and was noted to have a fever. Pt is a cancer pt, last chemo treatment approx 2 weeks ago.

## 2023-01-15 NOTE — Assessment & Plan Note (Signed)
-  Has documented EF of 54% on 06/2022-not able to see full report in epic -monitor fluid status since she received aggressive IV fluid with sepsis -continue beta-blocker and Entresto

## 2023-01-15 NOTE — Progress Notes (Signed)
Pharmacy Note   A consult was received from an ED physician for vancomycin and cefepime per pharmacy dosing.    The patient's profile has been reviewed for ht/wt/allergies/indication/available labs.    A one time order has been placed for vancomycin 1500 mg IV x1 and cefepime 2 gr IV x1 .  No weight entered on this visit. However, on visit to [facility], listed wt was 92.5 kg.     Further antibiotics/pharmacy consults should be ordered by admitting physician if indicated.                       Thank you,  Adalberto Cole, PharmD, BCPS 01/15/2023 6:30 PM

## 2023-01-15 NOTE — ED Provider Notes (Signed)
[place] EMERGENCY DEPARTMENT AT [facility] Provider Note   CSN: [id number] Arrival date & time: 01/15/23  1649     History  Chief Complaint  Patient presents with   Fever    Maria Monroe is a 52 y.o. female with a past medical history of stage IV breast cancer presenting today with concern for fever.  She reports that she recently was in the hospital for laryngitis and difficulty breathing secondary to this.  She tells me her vocal cords were so inflamed that it was causing her to have fevers.  She believes that she was not adequately treated prior to discharge and that is why she is now having elevated temperatures.  She tells me that she was also concerned she could have a UTI because she was having darker colored urine and lower abdominal discomfort.  She went to urgent care and she was found to be febrile.  She is currently on chemotherapy   Fever Associated symptoms: congestion, cough and sore throat        Home Medications Prior to Admission medications   Medication Sig Start Date End Date Taking? Authorizing Provider  carvedilol (COREG) 25 MG tablet Take 25 mg by mouth 2 (two) times daily. 12/23/21   [provider]  lidocaine (XYLOCAINE) 2 % solution Use as directed 5 mLs in the mouth or throat 4 (four) times daily as needed for mouth pain.    [provider]  loratadine (CLARITIN) 10 MG tablet Take 10 mg by mouth daily as needed for allergies.    [provider]  Multiple Vitamin (MULTIVITAMIN) tablet Take 1 tablet by mouth daily.    [provider]  oxyCODONE (OXY IR/ROXICODONE) 5 MG immediate release tablet Take 5 mg by mouth every 4 (four) hours as needed for severe pain.    [provider]  predniSONE (DELTASONE) 10 MG tablet Take 4 tablets daily for three days, then take 2 tablets daily for three days, and then one tablet daily for 3 days, then half tablet daily for 2 days. 12/17/22   Arrien, York Ram, MD  sacubitril-valsartan (ENTRESTO) 24-26 MG Take 1 tablet by mouth 2 (two) times daily.    [provider]      Allergies    Asa [aspirin], Tomato, Citric acid, Corn-containing products, and Relafen [nabumetone]    Review of Systems   Review of Systems  Constitutional:  Positive for fever.  HENT:  Positive for congestion and sore throat.   Respiratory:  Positive for cough.     Physical Exam Updated Vital Signs BP (!) 124/111 (BP Location: Left Arm)   Pulse (!) 110   Temp (!) 102.5 F (39.2 C) (Oral)   Resp 18   SpO2 98%  Physical Exam Vitals and nursing note reviewed.  Constitutional:      General: She is not in acute distress.    Appearance: Normal appearance. She is not ill-appearing.  HENT:     Head: Normocephalic and atraumatic.     Mouth/Throat:     Mouth: Mucous membranes are moist.     Pharynx: Oropharynx is clear.     Comments: No swelling, foreign body or signs of infection in the oropharynx. Eyes:     General: No scleral icterus.    Conjunctiva/sclera: Conjunctivae normal.  Cardiovascular:     Rate and Rhythm: Regular rhythm. Tachycardia present.  Pulmonary:     Effort: Pulmonary effort is normal. No respiratory distress.     Breath sounds:  No wheezing or rales.  Abdominal:     General: Abdomen is flat.     Palpations: Abdomen is soft.     Tenderness: There is no abdominal tenderness.  Skin:    Findings: No rash.  Neurological:     Mental Status: She is alert.  Psychiatric:        Mood and Affect: Mood normal.     Comments: Coarse voice secondary to laryngitis however normal behavior and oriented     ED Results / Procedures / Treatments   Labs (all labs ordered are listed, but only abnormal results are displayed) Labs Reviewed - No data to display  EKG None  Radiology CT ABDOMEN PELVIS W CONTRAST  Result Date: 01/15/2023 CLINICAL DATA:  Abdominal pain, acute, nonlocalized EXAM: CT ABDOMEN AND PELVIS WITH CONTRAST TECHNIQUE:  Multidetector CT imaging of the abdomen and pelvis was performed using the standard protocol following bolus administration of intravenous contrast. RADIATION DOSE REDUCTION: This exam was performed according to the departmental dose-optimization program which includes automated exposure control, adjustment of the mA and/or kV according to patient size and/or use of iterative reconstruction technique. CONTRAST:  OMNIPAQUE IOHEXOL 300 MG/ML  SOLN COMPARISON:  None Available. FINDINGS: Lower chest: There is multifocal consolidation within the basilar right middle lobe and posterior basal segment of the right lower lobe. Within the posterior basal right lower lobe there is associated bronchial wall thickening and extensive airway impaction. Together, the findings are in keeping with multifocal infection or aspiration. No pleural effusion. Cardiac size within normal limits. Hepatobiliary: Mild hepatic steatosis. No enhancing intrahepatic mass. No intra or extrahepatic biliary ductal dilation. Gallbladder unremarkable. Pancreas: Unremarkable Spleen: Unremarkable Adrenals/Urinary Tract: Adrenal glands are unremarkable. Kidneys are normal, without renal calculi, focal lesion, or hydronephrosis. Bladder is unremarkable. Stomach/Bowel: Mild to moderate pancolonic diverticulosis, most severe within the hepatic flexure. The stomach, small bowel, and large bowel are otherwise unremarkable. No evidence of obstruction or focal inflammation. Appendix normal. No free intraperitoneal gas or fluid. Vascular/Lymphatic: No significant vascular findings are present. No enlarged abdominal or pelvic lymph nodes. Reproductive: Status post hysterectomy. No adnexal masses. Other: No abdominal wall hernia or abnormality. No abdominopelvic ascites. Musculoskeletal: No acute bone abnormality. No lytic or blastic bone lesion. IMPRESSION: 1. No acute intra-abdominal pathology identified. 2. Multifocal consolidation within the basilar right  middle lobe and posterior basal right lower lobe with associated bronchial wall thickening and extensive airway impaction. In the acute setting, the findings are in keeping with multifocal infection or aspiration. 3. Mild hepatic steatosis. 4. Mild to moderate pancolonic diverticulosis without superimposed acute inflammatory change. Electronically Signed   By: Helyn Numbers M.D.   On: 01/15/2023 20:32   DG Chest 2 View  Result Date: 01/15/2023 CLINICAL DATA:  Fever with abdominal pressure.  Breast cancer. EXAM: CHEST - 2 VIEW COMPARISON:  Radiographs 04/13/2022 and 06/08/2020. Chest CT 04/13/2022. FINDINGS: Left IJ Port-A-Cath extends to the level of the superior cavoatrial junction, unchanged. Grossly stable right perihilar opacity compared with prior radiographs and CT, most likely due to sequela of prior radiation therapy. No evidence of superimposed airspace disease, edema, pleural effusion or pneumothorax. There are postsurgical changes in the right breast and right axilla. Previously demonstrated sclerotic metastases within the cervicothoracic spine are not well visualized. No acute osseous findings are seen. IMPRESSION: No evidence of acute cardiopulmonary process. Grossly stable right perihilar opacity, most likely due to sequela of prior radiation therapy. Electronically Signed   By: Hilarie Fredrickson.D.  On: 01/15/2023 17:56    Procedures .Critical Care  Performed by: Saddie Benders, PA-C Authorized by: Saddie Benders, PA-C   Critical care provider statement:    Critical care time (minutes):  60   Critical care time was exclusive of:  Separately billable procedures and treating other patients   Critical care was necessary to treat or prevent imminent or life-threatening deterioration of the following conditions:  Sepsis and respiratory failure   Critical care was time spent personally by me on the following activities:  Development of treatment plan with patient or surrogate,  discussions with primary provider, evaluation of patient's response to treatment, examination of patient, obtaining history from patient or surrogate, ordering and review of laboratory studies, ordering and review of radiographic studies, pulse oximetry, re-evaluation of patient's condition, review of old charts and ordering and performing treatments and interventions   I assumed direction of critical care for this patient from another provider in my specialty: no     Care discussed with: admitting provider      Medications Ordered in ED Medications  lactated ringers infusion (has no administration in time range)  lactated ringers bolus 1,000 mL (1,000 mLs Intravenous New Bag/Given 01/15/23 1917)    And  lactated ringers bolus 1,000 mL (1,000 mLs Intravenous New Bag/Given 01/15/23 1912)    And  lactated ringers bolus 1,000 mL (has no administration in time range)    And  lactated ringers bolus 500 mL (has no administration in time range)  metroNIDAZOLE (FLAGYL) IVPB 500 mg (500 mg Intravenous New Bag/Given 01/15/23 2007)  vancomycin (VANCOREADY) IVPB 1500 mg/300 mL (1,500 mg Intravenous New Bag/Given 01/15/23 2005)  carvedilol (COREG) tablet 25 mg (has no administration in time range)  sacubitril-valsartan (ENTRESTO) 24-26 mg per tablet (has no administration in time range)  acetaminophen (TYLENOL) tablet 1,000 mg (1,000 mg Oral Given 01/15/23 1825)  sodium chloride 0.9 % bolus 1,000 mL (1,000 mLs Intravenous New Bag/Given 01/15/23 1917)  ceFEPIme (MAXIPIME) 2 g in sodium chloride 0.9 % 100 mL IVPB (2 g Intravenous New Bag/Given 01/15/23 1915)  iohexol (OMNIPAQUE) 300 MG/ML solution 100 mL (100 mLs Intravenous Contrast Given 01/15/23 2013)    ED Course/ Medical Decision Making/ A&P                             Medical Decision Making Amount and/or Complexity of Data Reviewed Labs: ordered. Radiology: ordered. ECG/medicine tests: ordered.  Risk OTC drugs. Prescription drug  management. Decision regarding hospitalization.   52 year old female presenting today with fever.  Differential includes but is not limited to respiratory source such as pneumonia, COVID, flu, RSV, intra-abdominal source such as cholecystitis, appendicitis, colitis, gastroenteritis, chemotherapy related fever this is not an exhaustive differential.    Past Medical History / Co-morbidities / Social History: Stage IV breast cancer currently under chemotherapy treatment, hypertension   Additional history: Per chart review patient was having concerns of UTI so she went to urgent care.  Urinalysis in urgent care was negative for acute infection.   physical Exam: Pertinent physical exam findings include Sinus tachycardia CTAB Airway clear, tolerating secretions.  No signs of abscess or infections in the oropharynx  Lab Tests: I ordered, and personally interpreted labs.  The pertinent results include: Sodium 129 Leukocytosis 13.6, neutrophil count normal.   Imaging Studies: Negative chest x-ray  CT abdomen pelvis reveals potential pneumonia in the right middle and lower lung lobes.   Cardiac Monitoring:  The  patient was maintained on a cardiac monitor.  I viewed and interpreted the cardiac monitored which showed an underlying rhythm of: Sinus   Medications: Tylenol for fever, fluids and broad-spectrum antibiotics for sepsis protocol.  Also received her at home antihypertensives.   MDM/Disposition: This is a 52 year old female who presented today with concern for fever.  She first thought she had UTI however this was normal at urgent care.  She is a cancer patient on chemotherapy so concern included neutropenic fever.  Patient had a WBC count of 13.6 with normal neutrophils.  Urinalysis is clear.  Chest x-ray does not reveal pneumonia however CT abdomen pelvis reveals some concern for right-sided pneumonia.  At this time I believe patient should be admitted to the hospital for sepsis  secondary to pneumonia.  She was in the hospital 30 days ago so she likely needs to continue to be on broad-spectrum antibiotics coverage for HCAP.  Patient is agreeable to admission at this time.  Admit to Dr. Cyndia Bent    Final Clinical Impression(s) / ED Diagnoses Final diagnoses:  Pneumonia of right lower lobe due to infectious organism    Rx / DC Orders ED Discharge Orders     None        I discussed this case with my attending physician Dr. Rubin Payor who cosigned this note including patient's presenting symptoms, physical exam, and planned diagnostics and interventions. Attending physician stated agreement with plan or made changes to plan which were implemented.      Woodroe Chen 01/15/23 2106    Benjiman Core, MD 01/16/23 2352

## 2023-01-15 NOTE — Assessment & Plan Note (Signed)
-  as evident with fever, tachycardia and pneumonia seen on CT imaging -secondary to likely aspiration pneumonia especially with hx of vocal cord paralysis -received broad spectrum antibiotics in ED. Continue with IV Unasyn.

## 2023-01-15 NOTE — Assessment & Plan Note (Signed)
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

## 2023-01-15 NOTE — Progress Notes (Signed)
Pharmacy Antibiotic Note  Maria Monroe is a 52 y.o. 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 .  Pharmacy has been consulted to dose unasyn for aspiration pna  Plan: Unasyn 3gm IV q6h Follow renal function and clinical course  Height: 5\' 5"  (165.1 cm) Weight: 99.8 kg (220 lb 0.3 oz) IBW/kg (Calculated) : 57  Temp (24hrs), Avg:100.6 F (38.1 C), Min:98.2 F (36.8 C), Max:102.9 F (39.4 C)  Recent Labs  Lab 01/15/23 1900  WBC 13.6*  CREATININE 0.82  LATICACIDVEN 1.2    Estimated Creatinine Clearance: 94.9 mL/min (by C-G formula based on SCr of 0.82 mg/dL).    Allergies  Allergen Reactions   Asa [Aspirin] Hives   Tomato Hives, Itching and Swelling   Citric Acid Hives   Corn Oil Nausea And Vomiting   Corn-Containing Products Nausea And Vomiting and Other (See Comments)    Told to avoid corn due to digestive issues by oncologist    Prednisone Other (See Comments)    Caused thrush and yeast issues   Relafen [Nabumetone] Itching and Other (See Comments)    Throat itching     Thank you for allowing pharmacy to be a part of this patient's care.  Arley Phenix RPh 01/15/2023, 10:56 PM

## 2023-01-15 NOTE — H&P (Signed)
History and Physical    Patient: Maria Monroe KGM:[id number] DOB: 11/15/1970 DOA: 01/15/2023 DOS: the patient was seen and examined on 01/15/2023 PCP: Enid Baas, MD  Patient coming from: Home  Chief Complaint:  Chief Complaint  Patient presents with   Fever   HPI: Maria Monroe is a 52 y.o. 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  She went to urgent care earlier today with fever and due to her being currently on chemotherapy was sent to ED for more expedited workup. Reports feeling mid lower abdominal pressure but no dysuria, increase frequency or urgency. Denies diarrhea or constipation. No cough or shortness of breath but has persistent sinus congestion followed by ENT. Also has vocal cord paralysis from hx of radiation but denies any difficulty with swallowing.    In the ED, she was septic with temperature of 102.36F, tachycardic to 110 with leukocytosis of 13.6. Abs neutrophils of 8.4.  Hyponatremia of 128, K of 3.4, Cl of 21, creatinine of 0.82.   UA negative for any leukocytes, nitrates or bacteria.   CT abd/pelvis showed multifocal pneumonia.   She was initiated on broad spectrum antibiotics with IV vancomycin, Flagyl and Cefepime before results of workup. Has been given 3L of IV fluid bolus.    Review of Systems: As mentioned in the history of present illness. All other systems reviewed and are negative. Past Medical History:  Diagnosis Date   Arthritis    Asthma    Bone metastasis    Cancer (HCC)    overlapping sites of right breast   DVT (deep venous thrombosis) (HCC)    H/O bilateral mastectomy    HER2-positive carcinoma of right breast (HCC)    Hypertension    Lymphedema of both arms    Past Surgical History:  Procedure  Laterality Date   ABDOMINAL HYSTERECTOMY     BRONCHOSCOPY     x3   CESAREAN SECTION     x2   COLONOSCOPY N/A 02/26/2022   Procedure: COLONOSCOPY;  Surgeon: Toledo, Boykin Nearing, MD;  Location: [facility] ENDOSCOPY;  Service: Gastroenterology;  Laterality: N/A;   MASTECTOMY     PORTA CATH INSERTION Left    TUBAL LIGATION     Social History:  reports that she quit smoking about 12 years ago. Her smoking use included cigarettes. She has a 4.00 pack-year smoking history. She has never used smokeless tobacco. She reports that she does not drink alcohol and does not use drugs.  Allergies  Allergen Reactions   Asa [Aspirin] Hives   Tomato Hives, Itching and Swelling   Citric Acid Hives   Corn Oil Nausea And Vomiting   Corn-Containing Products Nausea And Vomiting and Other (See Comments)    Told to avoid corn due to digestive issues by oncologist    Prednisone Other (See Comments)    Caused thrush and yeast issues   Relafen [Nabumetone] Itching and Other (See Comments)    Throat itching    History reviewed. No pertinent family history.  Prior to Admission medications   Medication Sig Start Date End Date Taking? Authorizing Provider  azelastine (ASTELIN) 0.1 % nasal spray Place 2 sprays into both nostrils 4 (four) times daily as needed for rhinitis (or inflammation). Use in each nostril as directed   Yes [provider]  carvedilol (COREG) 25 MG tablet Take 25  mg by mouth 2 (two) times daily. 12/23/21  Yes [provider]  Multiple Vitamin (MULTIVITAMIN) tablet Take 1 tablet by mouth daily.   Yes [provider]  oxyCODONE (OXY IR/ROXICODONE) 5 MG immediate release tablet Take 5 mg by mouth every 4 (four) hours as needed for severe pain.   Yes [provider]  sacubitril-valsartan (ENTRESTO) 24-26 MG Take 1 tablet by mouth 2 (two) times daily.   Yes [provider]  predniSONE (DELTASONE) 10 MG tablet Take 4 tablets daily for three days, then take 2  tablets daily for three days, and then one tablet daily for 3 days, then half tablet daily for 2 days. Patient not taking: Reported on 01/15/2023 12/17/22   Arrien, York Ram, MD    Physical Exam: Vitals:   01/15/23 1655 01/15/23 1926 01/15/23 2100 01/15/23 2130  BP: (!) 124/111 110/66 137/88 136/88  Pulse: (!) 110 (!) 102 95 93  Resp: 18 19 (!) 21 19  Temp: (!) 102.5 F (39.2 C) 98.9 F (37.2 C)    TempSrc: Oral Oral    SpO2: 98% 96% 98% 98%   Constitutional: NAD, calm, comfortable, non-toxic well appearing female appearing younger than stated age laying in bed Eyes: lids and conjunctivae normal ENMT: Mucous membranes are moist. Neck: normal, supple, Respiratory: diminished lung sounds throughout but no wheezing, no crackles. Normal respiratory effort. No accessory muscle use. On room air.  Cardiovascular: Regular rate and rhythm, no murmurs / rubs / gallops. No extremity edema.  Abdomen: no tenderness, no masses palpated. Soft, non-distended. Bowel sounds positive.  Musculoskeletal: no clubbing / cyanosis. No joint deformity upper and lower extremities. Good ROM,  Normal muscle tone.  Skin: no rashes, lesions, ulcers. No induration Neurologic: CN 2-12 grossly intact. Strength 5/5 in all 4.  Psychiatric: Normal judgment and insight. Alert and oriented x 3. Normal mood. Data Reviewed:  See HPI  Assessment and Plan: * Sepsis (HCC) -as evident with fever, tachycardia and pneumonia seen on CT imaging -secondary to likely aspiration pneumonia especially with hx of vocal cord paralysis -received broad spectrum antibiotics in ED. Continue with IV Unasyn.   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 Inpatient status is judged to be reasonable and necessary in order to provide the required intensity of service to ensure the patient's safety. The patient's presenting symptoms, physical exam findings, and initial radiographic and laboratory data in the context of their chronic comorbidities is felt to place them at high risk for further clinical deterioration. Furthermore, it is not anticipated that the patient will be medically stable for discharge from the hospital within 2 midnights of admission.   * I certify  that at the point of admission it is my clinical judgment that the patient will require inpatient hospital care spanning beyond 2 midnights from the point of admission due to high intensity of service, high risk for further deterioration and high frequency of surveillance required.*  Author: Anselm Jungling, DO 01/15/2023 10:32 PM  For on call review [email or web address].

## 2023-01-15 NOTE — Assessment & Plan Note (Signed)
-  replete with orak potassium

## 2023-01-16 ENCOUNTER — Other Ambulatory Visit: Payer: Self-pay

## 2023-01-16 DIAGNOSIS — A419 Sepsis, unspecified organism: Secondary | ICD-10-CM | POA: Diagnosis not present

## 2023-01-16 LAB — BASIC METABOLIC PANEL
Anion gap: 7 (ref 5–15)
BUN: <5 mg/dL — ABNORMAL LOW (ref 6–20)
CO2: 22 mmol/L (ref 22–32)
Calcium: 8.2 mg/dL — ABNORMAL LOW (ref 8.9–10.3)
Chloride: 106 mmol/L (ref 98–111)
Creatinine, Ser: 0.8 mg/dL (ref 0.44–1.00)
GFR, Estimated: >60 mL/min (ref >60)
Glucose, Bld: 106 mg/dL — ABNORMAL HIGH (ref 70–99)
Potassium: 3.6 mmol/L (ref 3.5–5.1)
Sodium: 135 mmol/L (ref 135–145)

## 2023-01-16 LAB — CBC
HCT: 31 % — ABNORMAL LOW (ref 36.0–46.0)
Hemoglobin: 10.1 g/dL — ABNORMAL LOW (ref 12.0–15.0)
MCH: 29.9 pg (ref 26.0–34.0)
MCHC: 32.6 g/dL (ref 30.0–36.0)
MCV: 91.7 fL (ref 80.0–100.0)
Platelets: 252 10*3/uL (ref 150–400)
RBC: 3.38 MIL/uL — ABNORMAL LOW (ref 3.87–5.11)
RDW: 13.9 % (ref 11.5–15.5)
WBC: 11.4 10*3/uL — ABNORMAL HIGH (ref 4.0–10.5)
nRBC: 0 % (ref 0.0–0.2)

## 2023-01-16 LAB — CULTURE, BLOOD (ROUTINE X 2): Culture: NO GROWTH

## 2023-01-16 LAB — LACTIC ACID, PLASMA: Lactic Acid, Venous: 1.3 mmol/L (ref 0.5–1.9)

## 2023-01-16 MED ORDER — IPRATROPIUM-ALBUTEROL 0.5-2.5 (3) MG/3ML IN SOLN
3.0000 mL | Freq: Four times a day (QID) | RESPIRATORY_TRACT | Status: DC
  Administered 2023-01-16 – 2023-01-17 (×4): 3 mL via RESPIRATORY_TRACT
  Filled 2023-01-16 (×4): qty 3

## 2023-01-16 MED ORDER — CHLORHEXIDINE GLUCONATE CLOTH 2 % EX PADS
6.0000 | MEDICATED_PAD | Freq: Every day | CUTANEOUS | Status: DC
  Administered 2023-01-16 – 2023-01-17 (×2): 6 via TOPICAL

## 2023-01-16 MED ORDER — BOOST / RESOURCE BREEZE PO LIQD CUSTOM
1.0000 | Freq: Three times a day (TID) | ORAL | Status: DC
  Administered 2023-01-16 – 2023-01-17 (×3): 1 via ORAL

## 2023-01-16 MED ORDER — IPRATROPIUM-ALBUTEROL 0.5-2.5 (3) MG/3ML IN SOLN: 3.0000 mL | Freq: Four times a day (QID) | RESPIRATORY_TRACT | Status: DC

## 2023-01-16 NOTE — Evaluation (Signed)
Clinical/Bedside Swallow Evaluation Patient Details  Name: Maria Monroe MRN: [id number] Date of Birth: 12/30/1970  Today's Date: 01/16/2023 Time: SLP Start Time (ACUTE ONLY): 1000 SLP Stop Time (ACUTE ONLY): 1016 SLP Time Calculation (min) (ACUTE ONLY): 16 min  Past Medical History:  Past Medical History:  Diagnosis Date   Arthritis    Asthma    Bone metastasis    Cancer (HCC)    overlapping sites of right breast   DVT (deep venous thrombosis) (HCC)    H/O bilateral mastectomy    HER2-positive carcinoma of right breast (HCC)    Hypertension    Lymphedema of both arms    Past Surgical History:  Past Surgical History:  Procedure Laterality Date   ABDOMINAL HYSTERECTOMY     BRONCHOSCOPY     x3   CESAREAN SECTION     x2   COLONOSCOPY N/A 02/26/2022   Procedure: COLONOSCOPY;  Surgeon: Toledo, Boykin Nearing, MD;  Location: [facility] ENDOSCOPY;  Service: Gastroenterology;  Laterality: N/A;   MASTECTOMY     PORTA CATH INSERTION Left    TUBAL LIGATION     HPI:  Maria Monroe is a 52 y.o. 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 CT abd/pelvis showed multifocal pneumonia.    Assessment / Plan / Recommendation  Clinical Impression  Swallow evaluation at bedside complete. Patient presents with what appears to be a functional oropharyngeal swallow with thin liquids and pureed solids with no overt indication of aspiration. She declined regular texture solids due to combination of chronic dry mouth and increased WOB (waiting for breathing treatment). She reports that she does have occassional "strangling" with pos. Despite no s/s of aspiration observed she does have multiple risk factors for dysphagia with potential for aspiration including chronic dry mouth and vocal cord  paralysis/paresis s/p radiation treatment. Has never had swallowing evaluated objectively. will plan for MBS. SLP Visit Diagnosis: Dysphagia, unspecified (R13.10)    Aspiration Risk       Diet Recommendation Regular;Thin liquid (per patient, she will choose softer items independently)   Liquid Administration via: Cup;Straw Medication Administration: Whole meds with liquid Supervision: Patient able to self feed Compensations: Small sips/bites Postural Changes: Seated upright at 90 degrees    Other  Recommendations Oral Care Recommendations: Oral care BID (minimum, more as needed)    Recommendations for follow up therapy are one component of a multi-disciplinary discharge planning process, led by the attending physician.  Recommendations may be updated based on patient status, additional functional criteria and insurance authorization.    Swallow Study   General HPI: Maria Monroe is a 52 y.o. 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 CT abd/pelvis showed multifocal pneumonia. Type of Study: Bedside Swallow Evaluation Previous Swallow Assessment: none noted in chart Diet Prior to this Study: Regular;Thin liquids (Level 0) Temperature Spikes Noted: Yes Respiratory Status: Room air History of Recent Intubation: No Behavior/Cognition: Alert;Cooperative;Pleasant mood Oral Cavity Assessment: Within Functional Limits Oral Care Completed by SLP: Recent completion by staff Oral Cavity - Dentition: Adequate natural dentition Vision: Functional for self-feeding Self-Feeding Abilities: Able to feed self Patient Positioning: Upright in bed Baseline Vocal Quality: Breathy;Aphonic (largely aphonic) Volitional Cough: Weak Volitional  Swallow: Able to elicit    Oral/Motor/Sensory  Function Overall Oral Motor/Sensory Function: Within functional limits   Ice Chips Ice chips: Not tested   Thin Liquid Thin Liquid: Within functional limits Presentation: Self Fed;Straw    Nectar Thick Nectar Thick Liquid: Not tested   Honey Thick Honey Thick Liquid: Not tested   Puree Puree: Within functional limits Presentation: Self Fed;Spoon   Solid     Solid: Not tested     Maria Vinciguerra MA, CCC-SLP  Maria Monroe Maria Monroe 01/16/2023,10:30 AM

## 2023-01-16 NOTE — TOC Progression Note (Signed)
Transition of Care [facility]) - Progression Note    Patient Details  Name: Maria Monroe MRN: [id number] Date of Birth: 11-03-1970  Transition of Care [facility]) CM/SW Contact  Beckie Busing, RN Phone Number:[phone]  01/16/2023, 3:23 PM  Clinical Narrative:     Transition of Care (TOC) Screening Note   Patient Details  Name: Maria Monroe Date of Birth: Feb 10, 1971   Transition of Care [facility]) CM/SW Contact:    Beckie Busing, RN Phone Number: 01/16/2023, 3:24 PM    Transition of Care Department [facility]) has reviewed patient and no TOC needs have been identified at this time. We will continue to monitor patient advancement through interdisciplinary progression rounds. If new patient transition needs arise, please place a TOC consult.           Expected Discharge Plan and Services                                               Social Determinants of Health (SDOH) Interventions SDOH Screenings   Food Insecurity: No Food Insecurity (01/16/2023)  Housing: Patient Declined (01/16/2023)  Transportation Needs: No Transportation Needs (01/16/2023)  Utilities: Not At Risk (01/16/2023)  Tobacco Use: Medium Risk (01/15/2023)    Readmission Risk Interventions     No data to display

## 2023-01-16 NOTE — Progress Notes (Signed)
Chaplain engaged in an initial visit with Maria Monroe. Maria Monroe shared her healthcare journey of finding out she had cancer 12 years ago. She detailed the numerous things that have happened to her body throughout that time. Maria Monroe continues to keep going through her relationship with God and choosing herself. She voiced that she decides not to pity herself. Maria Monroe stated that she takes it day by day. Maria Monroe revealed her philosophy around life and what allows her the strength to keep fighting.   Chaplain also spent time talking about her community. She has children and grandchildren that check in on her daily. She also has friends within a faith community that she talks with. Maria Monroe lives alone and primarily likes to be alone as to not get sick. She is careful not to have too many people around her to protect her health and body. Chaplain assesses that it would be good for Maria Monroe to join a virtual support group or have more access to community.   Chaplain and Maria Monroe talked about the history of cancer in her family. Chaplain encouraged Maria Monroe to talk to her children about genetic testing. Maria Monroe also detailed the grief of losing so many family members so close together. Chaplain offered reflective listening and compassion.   Chaplain worked to offer support and recognized the ways Maria Monroe has had to learn to cope and build strength through her health journey. Chaplain will continue to follow-up.    01/16/23 1100  Spiritual Encounters  Type of Visit Initial  Care provided to: Patient  Spiritual Framework  Presenting Themes Community and relationships;Rituals and practive;Courage hope and growth;Impactful experiences and emotions;Values and beliefs;Significant life change  Community/Connection Faith community;Family  Interventions  Spiritual Care Interventions Made Compassionate presence;Established relationship of care and support;Reflective listening;Normalization of  emotions;Explored values/beliefs/practices/strengths;Encouragement  Intervention Outcomes  Outcomes Connection to spiritual care;Awareness around self/spiritual resourses;Awareness of support

## 2023-01-16 NOTE — TOC Progression Note (Deleted)
Transition of Care [facility]) - Progression Note    Patient Details  Name: [name] MRN: [id number] Date of Birth: 02/25/1971  Transition of Care [facility]) CM/SW Contact  Beckie Busing, RN Phone [phone]  01/16/2023, 3:30 PM  Clinical Narrative:     Transition of Care (TOC) Screening Note   Patient Details  Name: [name] Date of Birth: 10-10-1970   Transition of Care [facility]) CM/SW Contact:    Beckie Busing, RN Phone Number: 01/16/2023, 3:30 PM    Transition of Care Department [facility]) has reviewed patient and no TOC needs have been identified at this time. We will continue to monitor patient advancement through interdisciplinary progression rounds. If new patient transition needs arise, please place a TOC consult.          Expected Discharge Plan and Services                                               Social Determinants of Health (SDOH) Interventions SDOH Screenings   Food Insecurity: No Food Insecurity (01/16/2023)  Housing: Patient Declined (01/16/2023)  Transportation Needs: No Transportation Needs (01/16/2023)  Utilities: Not At Risk (01/16/2023)  Tobacco Use: Medium Risk (01/15/2023)    Readmission Risk Interventions     No data to display

## 2023-01-16 NOTE — Progress Notes (Addendum)
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  She was also complaining of increased frequency and urgency .On presentation, she was found to be febrile, tachycardic.  Lab work showed leukocytosis of 13.6, sodium of 128, potassium 3.4.  CT abdomen/pelvis showed multifocal pneumonia.  She was started on broad spectrum antibiotics.  Assessment & Plan:  Principal Problem:   Sepsis (HCC) Active Problems:   Paralysis of left vocal cord   HFrEF (heart failure with reduced ejection fraction) (HCC)   Malignant neoplasm metastatic to bone (HCC)   CAP (community acquired pneumonia)   HTN (hypertension)   Hyponatremia   Hypokalemia  Sepsis secondary to multifocal pneumonia: Presented with fever, tachycardia, leukocytosis.  Continue current antibiotics.  Follow-up cultures.  Afebrile this morning.  Leukocytosis improving  Multifocal pneumonia: CT abdomen/pelvis showed Multifocal consolidation within the basilar right middle lobe and posterior basal right lower lobe with associated bronchial wall thickening and extensive airway impaction. Suspected aspiration.  Continue current antibiotics. Currently saturating fine on room air.  Vocal cord paralysis/hoarseness: Secondary to radiation therapy for breast cancer.  Follows with the ENT.  She was found to have unilateral vocal cord paralysis and edema .she was tried on prednisone but she developed thrush and could not tolerate it.  Speech therapy consulted here, recommended regular diet with thin liquid.  She does not have any wheezing today.  Lungs are almost clear on  auscultation.  She is denies trying any steroids.  Continue DuoNeb  Heart failure with reduced ejection fraction: Currently on beta-blocker, Entresto.  Last echo done on 11/2020 showed EF of 15%.  Metastatic breast cancer: Bilateral breast cancer with mets to bone and lungs.  Follows with [facility] for chemotherapy  Hypokalemia: Currently being monitored and supplemented  Hyponatremia: Likely secondary to hypovolemic hyponatremia, with IV fluids  Hypertension: On beta-blocker and Entresto.  Blood pressure stable this morning  Anemia: Likely secondary to malignancy.  Currently hemoglobin stable in the range of 10         DVT prophylaxis:enoxaparin (LOVENOX) injection 40 mg Start: 01/16/23 1000     Code Status: Full Code  Family Communication: Called daughter for update on 5/17 /24 ,call not received  Patient status:Inpatient  Patient is from :Home  Anticipated discharge WJ:XBJY  Estimated DC date:1-2 days   Consultants: None  Procedures:None  Antimicrobials:  Anti-infectives (From admission, onward)    Start     Dose/Rate Route Frequency Ordered Stop   01/16/23 0200  Ampicillin-Sulbactam (UNASYN) 3 g in sodium chloride 0.9 % 100 mL IVPB        3 g 200 mL/hr over 30 Minutes Intravenous Every 6 hours 01/15/23 2254     01/15/23 1845  vancomycin (VANCOREADY) IVPB 1500 mg/300 mL        1,500 mg 150 mL/hr over 120 Minutes Intravenous  Once 01/15/23 1830 01/15/23 2205   01/15/23 1815  ceFEPIme (MAXIPIME) 2 g in sodium chloride 0.9 % 100 mL IVPB        2 g 200 mL/hr over 30 Minutes Intravenous  Once 01/15/23 1814 01/15/23 1945   01/15/23 1815  metroNIDAZOLE (FLAGYL) IVPB 500 mg        500  mg 100 mL/hr over 60 Minutes Intravenous  Once 01/15/23 1814 01/15/23 2107   01/15/23 1815  vancomycin (VANCOCIN) IVPB 1000 mg/200 mL premix  Status:  Discontinued        1,000 mg 200 mL/hr over 60 Minutes Intravenous  Once 01/15/23 1814 01/15/23 1830       Subjective: Patient seen  and examined at bedside today.  Hemodynamically stable.  She was on room air.  She has hoarseness of voice and that is at baseline due to vocal cord paralysis.  Denies any worsening shortness of breath or cough.  She says her mouth is dry.  Appears slightly anxious  Objective: Vitals:   01/15/23 2130 01/15/23 2245 01/16/23 0247 01/16/23 0631  BP: 136/88 119/81 (!) 148/97 (!) 149/96  Pulse: 93 93 99 (!) 102  Resp: 19 18 18 18   Temp:  98.2 F (36.8 C) 99 F (37.2 C) 98.9 F (37.2 C)  TempSrc:  Oral Oral Oral  SpO2: 98% 100% 100% 100%  Weight:  99.8 kg    Height:  5\' 5"  (1.651 m)      Intake/Output Summary (Last 24 hours) at 01/16/2023 0829 Last data filed at 01/16/2023 0414 Gross per 24 hour  Intake 842.28 ml  Output --  Net 842.28 ml   Filed Weights   01/15/23 2245  Weight: 99.8 kg    Examination:  General exam: Overall comfortable, not in distress HEENT: PERRL, hoarseness of voice Respiratory system:  no wheezes or crackles  Cardiovascular system: S1 & S2 heard, RRR.  Gastrointestinal system: Abdomen is nondistended, soft and nontender. Central nervous system: Alert and oriented Extremities: No edema, no clubbing ,no cyanosis Skin: No rashes, no ulcers,no icterus     Data Reviewed: I have personally reviewed following labs and imaging studies  CBC: Recent Labs  Lab 01/15/23 1900  WBC 13.6*  NEUTROABS 8.4*  HGB 11.1*  HCT 33.6*  MCV 91.6  PLT 276   Basic Metabolic Panel: Recent Labs  Lab 01/15/23 1900  NA 128*  K 3.4*  CL 98  CO2 21*  GLUCOSE 94  BUN 6  CREATININE 0.82  CALCIUM 8.4*     Recent Results (from the past 240 hour(s))  Resp panel by RT-PCR (RSV, Flu A&B, Covid) Anterior Nasal Swab     Status: None   Collection Time: 01/15/23  6:59 PM   Specimen: Anterior Nasal Swab  Result Value Ref Range Status   SARS Coronavirus 2 by RT PCR NEGATIVE NEGATIVE Final    Comment: (NOTE) SARS-CoV-2 target nucleic acids are NOT DETECTED.  The  SARS-CoV-2 RNA is generally detectable in upper respiratory specimens during the acute phase of infection. The lowest concentration of SARS-CoV-2 viral copies this assay can detect is 138 copies/mL. A negative result does not preclude SARS-Cov-2 infection and should not be used as the sole basis for treatment or other patient management decisions. A negative result may occur with  improper specimen collection/handling, submission of specimen other than nasopharyngeal swab, presence of viral mutation(s) within the areas targeted by this assay, and inadequate number of viral copies(<138 copies/mL). A negative result must be combined with clinical observations, patient history, and epidemiological information. The expected result is Negative.  Fact Sheet for Patients:  BloggerCourse.com  Fact Sheet for Healthcare Providers:  SeriousBroker.it  This test is no t yet approved or cleared by the [place] FDA and  has been authorized for detection and/or diagnosis of SARS-CoV-2 by FDA under an Emergency Use Authorization (EUA). This EUA  will remain  in effect (meaning this test can be used) for the duration of the COVID-19 declaration under Section 564(b)(1) of the Act, 21 U.S.C.section 360bbb-3(b)(1), unless the authorization is terminated  or revoked sooner.       Influenza A by PCR NEGATIVE NEGATIVE Final   Influenza B by PCR NEGATIVE NEGATIVE Final    Comment: (NOTE) The Xpert Xpress SARS-CoV-2/FLU/RSV plus assay is intended as an aid in the diagnosis of influenza from Nasopharyngeal swab specimens and should not be used as a sole basis for treatment. Nasal washings and aspirates are unacceptable for Xpert Xpress SARS-CoV-2/FLU/RSV testing.  Fact Sheet for Patients: BloggerCourse.com  Fact Sheet for Healthcare Providers: SeriousBroker.it  This test is not yet approved or  cleared by the [place] FDA and has been authorized for detection and/or diagnosis of SARS-CoV-2 by FDA under an Emergency Use Authorization (EUA). This EUA will remain in effect (meaning this test can be used) for the duration of the COVID-19 declaration under Section 564(b)(1) of the Act, 21 U.S.C. section 360bbb-3(b)(1), unless the authorization is terminated or revoked.     Resp Syncytial Virus by PCR NEGATIVE NEGATIVE Final    Comment: (NOTE) Fact Sheet for Patients: BloggerCourse.com  Fact Sheet for Healthcare Providers: SeriousBroker.it  This test is not yet approved or cleared by the [place] FDA and has been authorized for detection and/or diagnosis of SARS-CoV-2 by FDA under an Emergency Use Authorization (EUA). This EUA will remain in effect (meaning this test can be used) for the duration of the COVID-19 declaration under Section 564(b)(1) of the Act, 21 U.S.C. section 360bbb-3(b)(1), unless the authorization is terminated or revoked.  Performed at [facility], [address] [place], [place] [address]      Radiology Studies: CT ABDOMEN PELVIS W CONTRAST  Result Date: 01/15/2023 CLINICAL DATA:  Abdominal pain, acute, nonlocalized EXAM: CT ABDOMEN AND PELVIS WITH CONTRAST TECHNIQUE: Multidetector CT imaging of the abdomen and pelvis was performed using the standard protocol following bolus administration of intravenous contrast. RADIATION DOSE REDUCTION: This exam was performed according to the departmental dose-optimization program which includes automated exposure control, adjustment of the mA and/or kV according to patient size and/or use of iterative reconstruction technique. CONTRAST:  OMNIPAQUE IOHEXOL 300 MG/ML  SOLN COMPARISON:  None Available. FINDINGS: Lower chest: There is multifocal consolidation within the basilar right middle lobe and posterior basal segment of the right  lower lobe. Within the posterior basal right lower lobe there is associated bronchial wall thickening and extensive airway impaction. Together, the findings are in keeping with multifocal infection or aspiration. No pleural effusion. Cardiac size within normal limits. Hepatobiliary: Mild hepatic steatosis. No enhancing intrahepatic mass. No intra or extrahepatic biliary ductal dilation. Gallbladder unremarkable. Pancreas: Unremarkable Spleen: Unremarkable Adrenals/Urinary Tract: Adrenal glands are unremarkable. Kidneys are normal, without renal calculi, focal lesion, or hydronephrosis. Bladder is unremarkable. Stomach/Bowel: Mild to moderate pancolonic diverticulosis, most severe within the hepatic flexure. The stomach, small bowel, and large bowel are otherwise unremarkable. No evidence of obstruction or focal inflammation. Appendix normal. No free intraperitoneal gas or fluid. Vascular/Lymphatic: No significant vascular findings are present. No enlarged abdominal or pelvic lymph nodes. Reproductive: Status post hysterectomy. No adnexal masses. Other: No abdominal wall hernia or abnormality. No abdominopelvic ascites. Musculoskeletal: No acute bone abnormality. No lytic or blastic bone lesion. IMPRESSION: 1. No acute intra-abdominal pathology identified. 2. Multifocal consolidation within the basilar right middle lobe and posterior basal right lower lobe with associated bronchial wall  thickening and extensive airway impaction. In the acute setting, the findings are in keeping with multifocal infection or aspiration. 3. Mild hepatic steatosis. 4. Mild to moderate pancolonic diverticulosis without superimposed acute inflammatory change. Electronically Signed   By: Helyn Numbers M.D.   On: 01/15/2023 20:32   DG Chest 2 View  Result Date: 01/15/2023 CLINICAL DATA:  Fever with abdominal pressure.  Breast cancer. EXAM: CHEST - 2 VIEW COMPARISON:  Radiographs 04/13/2022 and 06/08/2020. Chest CT 04/13/2022. FINDINGS:  Left IJ Port-A-Cath extends to the level of the superior cavoatrial junction, unchanged. Grossly stable right perihilar opacity compared with prior radiographs and CT, most likely due to sequela of prior radiation therapy. No evidence of superimposed airspace disease, edema, pleural effusion or pneumothorax. There are postsurgical changes in the right breast and right axilla. Previously demonstrated sclerotic metastases within the cervicothoracic spine are not well visualized. No acute osseous findings are seen. IMPRESSION: No evidence of acute cardiopulmonary process. Grossly stable right perihilar opacity, most likely due to sequela of prior radiation therapy. Electronically Signed   By: Carey Bullocks M.D.   On: 01/15/2023 17:56    Scheduled Meds:  carvedilol  25 mg Oral BID   enoxaparin (LOVENOX) injection  40 mg Subcutaneous Q24H   sacubitril-valsartan  1 tablet Oral BID   Continuous Infusions:  ampicillin-sulbactam (UNASYN) IV 3 g (01/16/23 0823)   lactated ringers 150 mL/hr at 01/16/23 0823     LOS: 1 day   Burnadette Pop, MD [facility] P5/17/2024, 8:29 AM

## 2023-01-16 NOTE — Progress Notes (Signed)
Initial Nutrition Assessment  DOCUMENTATION CODES:   Obesity unspecified  INTERVENTION:   -Boost Breeze po TID, each supplement provides 250 kcal and 9 grams of protein   NUTRITION DIAGNOSIS:   Increased nutrient needs related to cancer and cancer related treatments as evidenced by estimated needs.  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  Patient in room, raspy voice d/t vocal cord paralysis. Pt undergoing chemotherapy for metastatic breast cancer. Last treatment 2 weeks ago. Pt states she has not been able to eat solid foods for a while d/t this causing pain in her throat. Pt mainly consuming liquids as these go down with no issue. Agreeable to trying Boost Breeze once breathing treatment completed for today.   Per weight records, pt's weight has been trending up. Pt states she has been walking or using stationary bike daily to stay active.   Medications reviewed.  Labs reviewed.   NUTRITION - FOCUSED PHYSICAL EXAM:  No depletions noted.  Diet Order:   Diet Order             Diet Heart Room service appropriate? Yes; Fluid consistency: Thin  Diet effective now                   EDUCATION NEEDS:   No education needs have been identified at this time  Skin:  Skin Assessment: Reviewed RN Assessment  Last BM:  5/16  Height:   Ht Readings from Last 1 Encounters:  01/15/23 5\' 5"  (1.651 m)    Weight:   Wt Readings from Last 1 Encounters:  01/15/23 99.8 kg    BMI:  Body mass index is  36.61 kg/m.  Estimated Nutritional Needs:   Kcal:  1800-2000  Protein:  85-100g  Fluid:  2L/day   Tilda Franco, MS, RD, LDN Inpatient Clinical Dietitian Contact information available via Amion

## 2023-01-17 ENCOUNTER — Inpatient Hospital Stay (HOSPITAL_COMMUNITY): Payer: 59

## 2023-01-17 DIAGNOSIS — A419 Sepsis, unspecified organism: Secondary | ICD-10-CM | POA: Diagnosis not present

## 2023-01-17 LAB — CULTURE, BLOOD (ROUTINE X 2): Special Requests: ADEQUATE

## 2023-01-17 MED ORDER — HEPARIN SOD (PORK) LOCK FLUSH 100 UNIT/ML IV SOLN
500.0000 [IU] | Freq: Once | INTRAVENOUS | Status: AC
  Administered 2023-01-17: 500 [IU] via INTRAVENOUS
  Filled 2023-01-17: qty 5

## 2023-01-17 MED ORDER — AMOXICILLIN-POT CLAVULANATE 875-125 MG PO TABS
1.0000 | ORAL_TABLET | Freq: Two times a day (BID) | ORAL | Status: DC
  Administered 2023-01-17: 1 via ORAL
  Filled 2023-01-17: qty 1

## 2023-01-17 MED ORDER — AMOXICILLIN-POT CLAVULANATE 875-125 MG PO TABS: 1.0000 | ORAL_TABLET | Freq: Two times a day (BID) | ORAL | 0 refills | Status: AC

## 2023-01-17 MED ORDER — SODIUM CHLORIDE 0.9% FLUSH: 10.0000 mL | INTRAVENOUS | Status: DC | PRN

## 2023-01-17 MED ORDER — SODIUM CHLORIDE 0.9% FLUSH
10.0000 mL | Freq: Two times a day (BID) | INTRAVENOUS | Status: DC
  Administered 2023-01-17: 10 mL

## 2023-01-17 MED ORDER — IPRATROPIUM-ALBUTEROL 0.5-2.5 (3) MG/3ML IN SOLN: 3.0000 mL | Freq: Four times a day (QID) | RESPIRATORY_TRACT | 0 refills | Status: AC | PRN

## 2023-01-17 NOTE — Discharge Summary (Addendum)
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  She was also complaining of increased frequency and urgency .On presentation, she was found to be febrile, tachycardic.  Lab work showed leukocytosis of 13.6, sodium of 128, potassium 3.4.  CT abdomen/pelvis showed multifocal pneumonia.  She was started on broad spectrum antibiotics.  Respiratory status is significantly improved and she may remains on room air.  She is free of wheezing today.  Her hoarseness of voice is chronic and currently she is at baseline.  Remains medically stable for discharge to home with oral antibiotics.  We have recommended follow-up with ENT as an outpatient  Following problems were addressed during the hospitalization:  Sepsis secondary to multifocal pneumonia: Presented with fever, tachycardia, leukocytosis.  Cultures have been negative so far .afebrile this morning.  Leukocytosis improved.  Sepsis physiology has resolved   Multifocal pneumonia: CT abdomen/pelvis showed Multifocal consolidation within the basilar right middle lobe and posterior basal right lower lobe with associated bronchial wall thickening and extensive airway impaction. Suspected aspiration.  Started on broad-spectrum antibiotics.  Change to Augmentin today.  Remains on room air.  Lungs were clear to auscultation.  Speech therapy recommended regular diet, thin liquid Currently  saturating fine on room air.  Hoarseness of voice/vocal cord paralysis: Secondary to radiation therapy for breast cancer. She was admitted in the middle of April for dyspnea.  At that time ENT was consulted and she had flexible nasopharyngeal laryngoscopy and was found to have paralysis of left vocal cord.  She was recommended steroids on discharge but she is could not tolerate because she developed yeast.  She does not want to continue with steroids at this time.  She does not have any wheezing today, lungs were clear on auscultation.  Continue Augmentin.  Continue DuoNeb as needed.  We recommend follow-up with ENT as an outpatient with Dr. Jearld Fenton  Heart failure with reduced ejection fraction: Currently on beta-blocker, Entresto.  Last echo done on 11/2020 showed EF of 15%.   Metastatic breast cancer: Bilateral breast cancer with mets to bone and lungs.  Follows with [facility] for chemotherapy   Hypokalemia: Supplemented corrected   Hyponatremia: Likely secondary to hypovolemic hyponatremia, resolved with IV fluids   Hypertension: On beta-blocker and Entresto.  Blood pressure stable this morning   Anemia: Likely secondary to malignancy.  Currently hemoglobin stable in the range of 10   Discharge Diagnoses:  Principal Problem:   Sepsis (HCC) Active Problems:   Paralysis of left vocal cord   HFrEF (heart failure with reduced ejection fraction) (HCC)   Malignant neoplasm metastatic to bone (HCC)   CAP (community acquired pneumonia)   HTN (hypertension)   Hyponatremia   Hypokalemia    Discharge Instructions  Discharge Instructions     Diet - low sodium heart healthy   Complete by: As directed    Discharge instructions   Complete by: As directed    1)Please follow up with ENT in a week.name and number the provider has been attached 2)Take prescribed medications as instructed 2)Follow up  with PCP and your oncologist as an outpatient   For home use only DME Nebulizer machine   Complete  by: As directed    Patient needs a nebulizer to treat with the following condition: Wheezing   Length of Need: Lifetime   Increase activity slowly   Complete by: As directed       Allergies as of 01/17/2023       Reactions   Asa [aspirin] Hives   Tomato Hives, Itching, Swelling   Citric Acid Hives   Corn-containing Products Nausea And Vomiting, Other (See Comments)   Told to avoid corn due to digestive issues by oncologist    Prednisone Other (See Comments)   Caused thrush and yeast issues   Relafen [nabumetone] Itching, Other (See Comments)   Throat itching        Medication List     STOP taking these medications    predniSONE 10 MG tablet Commonly known as: DELTASONE       TAKE these medications    amoxicillin-clavulanate 875-125 MG tablet Commonly known as: AUGMENTIN Take 1 tablet by mouth 2 (two) times daily for 5 days.   azelastine 0.1 % nasal spray Commonly known as: ASTELIN Place 2 sprays into both nostrils 4 (four) times daily as needed for rhinitis (or inflammation). Use in each nostril as directed   carvedilol 25 MG tablet Commonly known as: COREG Take 25 mg by mouth 2 (two) times daily.   Entresto 24-26 MG Generic drug: sacubitril-valsartan Take 1 tablet by mouth 2 (two) times daily.   ipratropium-albuterol 0.5-2.5 (3) MG/3ML Soln Commonly known as: DUONEB Take 3 mLs by nebulization every 6 (six) hours as needed.   multivitamin tablet Take 1 tablet by mouth daily.   oxyCODONE 5 MG immediate release tablet Commonly known as: Oxy IR/ROXICODONE Take 5 mg by mouth every 4 (four) hours as needed for severe pain.               Durable Medical Equipment  (From admission, onward)           Start     Ordered   01/17/23 0000  For home use only DME Nebulizer machine       Question Answer Comment  Patient needs a nebulizer to treat with the following condition Wheezing   Length of Need Lifetime      01/17/23 0936             Follow-up Information     Enid Baas, MD. Schedule an appointment as soon as possible for a visit in 1 week(s).   Specialty: Internal Medicine Contact information: [address] [place] [address] [phone]                Allergies  Allergen Reactions   Asa [Aspirin] Hives   Tomato Hives, Itching and Swelling   Citric Acid Hives   Corn-Containing Products Nausea And Vomiting and Other (See Comments)    Told to avoid corn due to digestive issues by oncologist    Prednisone Other (See Comments)    Caused thrush and yeast issues   Relafen [Nabumetone] Itching and Other (See Comments)    Throat itching    Consultations: None   Procedures/Studies: CT ABDOMEN PELVIS W CONTRAST  Result Date: 01/15/2023 CLINICAL DATA:  Abdominal pain, acute, nonlocalized EXAM: CT ABDOMEN AND PELVIS WITH CONTRAST TECHNIQUE: Multidetector CT imaging of the abdomen and pelvis was performed using the standard protocol following bolus administration of intravenous contrast. RADIATION DOSE REDUCTION:  This exam was performed according to the departmental dose-optimization program which includes automated exposure control, adjustment of the mA and/or kV according to patient size and/or use of iterative reconstruction technique. CONTRAST:  OMNIPAQUE IOHEXOL 300 MG/ML  SOLN COMPARISON:  None Available. FINDINGS: Lower chest: There is multifocal consolidation within the basilar right middle lobe and posterior basal segment of the right lower lobe. Within the posterior basal right lower lobe there is associated bronchial wall thickening and extensive airway impaction. Together, the findings are in keeping with multifocal infection or aspiration. No pleural effusion. Cardiac size within normal limits. Hepatobiliary: Mild hepatic steatosis. No enhancing intrahepatic mass. No intra or extrahepatic biliary ductal dilation. Gallbladder unremarkable. Pancreas: Unremarkable Spleen: Unremarkable  Adrenals/Urinary Tract: Adrenal glands are unremarkable. Kidneys are normal, without renal calculi, focal lesion, or hydronephrosis. Bladder is unremarkable. Stomach/Bowel: Mild to moderate pancolonic diverticulosis, most severe within the hepatic flexure. The stomach, small bowel, and large bowel are otherwise unremarkable. No evidence of obstruction or focal inflammation. Appendix normal. No free intraperitoneal gas or fluid. Vascular/Lymphatic: No significant vascular findings are present. No enlarged abdominal or pelvic lymph nodes. Reproductive: Status post hysterectomy. No adnexal masses. Other: No abdominal wall hernia or abnormality. No abdominopelvic ascites. Musculoskeletal: No acute bone abnormality. No lytic or blastic bone lesion. IMPRESSION: 1. No acute intra-abdominal pathology identified. 2. Multifocal consolidation within the basilar right middle lobe and posterior basal right lower lobe with associated bronchial wall thickening and extensive airway impaction. In the acute setting, the findings are in keeping with multifocal infection or aspiration. 3. Mild hepatic steatosis. 4. Mild to moderate pancolonic diverticulosis without superimposed acute inflammatory change. Electronically Signed   By: Helyn Numbers M.D.   On: 01/15/2023 20:32   DG Chest 2 View  Result Date: 01/15/2023 CLINICAL DATA:  Fever with abdominal pressure.  Breast cancer. EXAM: CHEST - 2 VIEW COMPARISON:  Radiographs 04/13/2022 and 06/08/2020. Chest CT 04/13/2022. FINDINGS: Left IJ Port-A-Cath extends to the level of the superior cavoatrial junction, unchanged. Grossly stable right perihilar opacity compared with prior radiographs and CT, most likely due to sequela of prior radiation therapy. No evidence of superimposed airspace disease, edema, pleural effusion or pneumothorax. There are postsurgical changes in the right breast and right axilla. Previously demonstrated sclerotic metastases within the cervicothoracic spine are  not well visualized. No acute osseous findings are seen. IMPRESSION: No evidence of acute cardiopulmonary process. Grossly stable right perihilar opacity, most likely due to sequela of prior radiation therapy. Electronically Signed   By: Carey Bullocks M.D.   On: 01/15/2023 17:56      Subjective: Patient seen and examined the bedside today.  Hemodynamically stable for discharge.  Remains on room air.  Voice is at baseline.  She has chronic hoarseness due to vocal cord paralysis.  I called her daughter and discussed about discharge planning  Discharge Exam: Vitals:   01/17/23 0638 01/17/23 0753  BP: (!) 144/99   Pulse: 89   Resp: 14   Temp: 98.4 F (36.9 C)   SpO2: 99% 98%   Vitals:   01/16/23 2303 01/17/23 0150 01/17/23 0638 01/17/23 0753  BP:   (!) 144/99   Pulse:   89   Resp:   14   Temp: 99.4 F (37.4 C)  98.4 F (36.9 C)   TempSrc: Oral  Oral   SpO2:  100% 99% 98%  Weight:      Height:        General: Pt is alert, awake, not in  acute distress Cardiovascular: RRR, S1/S2 +, no rubs, no gallops Respiratory: CTA bilaterally, no wheezing, no rhonchi Abdominal: Soft, NT, ND, bowel sounds + Extremities: no edema, no cyanosis    The results of significant diagnostics from this hospitalization (including imaging, microbiology, ancillary and laboratory) are listed below for reference.     Microbiology: Recent Results (from the past 240 hour(s))  Resp panel by RT-PCR (RSV, Flu A&B, Covid) Anterior Nasal Swab     Status: None   Collection Time: 01/15/23  6:59 PM   Specimen: Anterior Nasal Swab  Result Value Ref Range Status   SARS Coronavirus 2 by RT PCR NEGATIVE NEGATIVE Final    Comment: (NOTE) SARS-CoV-2 target nucleic acids are NOT DETECTED.  The SARS-CoV-2 RNA is generally detectable in upper respiratory specimens during the acute phase of infection. The lowest concentration of SARS-CoV-2 viral copies this assay can detect is 138 copies/mL. A negative result does  not preclude SARS-Cov-2 infection and should not be used as the sole basis for treatment or other patient management decisions. A negative result may occur with  improper specimen collection/handling, submission of specimen other than nasopharyngeal swab, presence of viral mutation(s) within the areas targeted by this assay, and inadequate number of viral copies(<138 copies/mL). A negative result must be combined with clinical observations, patient history, and epidemiological information. The expected result is Negative.  Fact Sheet for Patients:  BloggerCourse.com  Fact Sheet for Healthcare Providers:  SeriousBroker.it  This test is no t yet approved or cleared by the [place] FDA and  has been authorized for detection and/or diagnosis of SARS-CoV-2 by FDA under an Emergency Use Authorization (EUA). This EUA will remain  in effect (meaning this test can be used) for the duration of the COVID-19 declaration under Section 564(b)(1) of the Act, 21 U.S.C.section 360bbb-3(b)(1), unless the authorization is terminated  or revoked sooner.       Influenza A by PCR NEGATIVE NEGATIVE Final   Influenza B by PCR NEGATIVE NEGATIVE Final    Comment: (NOTE) The Xpert Xpress SARS-CoV-2/FLU/RSV plus assay is intended as an aid in the diagnosis of influenza from Nasopharyngeal swab specimens and should not be used as a sole basis for treatment. Nasal washings and aspirates are unacceptable for Xpert Xpress SARS-CoV-2/FLU/RSV testing.  Fact Sheet for Patients: BloggerCourse.com  Fact Sheet for Healthcare Providers: SeriousBroker.it  This test is not yet approved or cleared by the [place] FDA and has been authorized for detection and/or diagnosis of SARS-CoV-2 by FDA under an Emergency Use Authorization (EUA). This EUA will remain in effect (meaning this test can be used) for the  duration of the COVID-19 declaration under Section 564(b)(1) of the Act, 21 U.S.C. section 360bbb-3(b)(1), unless the authorization is terminated or revoked.     Resp Syncytial Virus by PCR NEGATIVE NEGATIVE Final    Comment: (NOTE) Fact Sheet for Patients: BloggerCourse.com  Fact Sheet for Healthcare Providers: SeriousBroker.it  This test is not yet approved or cleared by the [place] FDA and has been authorized for detection and/or diagnosis of SARS-CoV-2 by FDA under an Emergency Use Authorization (EUA). This EUA will remain in effect (meaning this test can be used) for the duration of the COVID-19 declaration under Section 564(b)(1) of the Act, 21 U.S.C. section 360bbb-3(b)(1), unless the authorization is terminated or revoked.  Performed at [facility], [address] [place], [place] [address]   Blood Culture (routine x 2)     Status: None (Preliminary result)  Collection Time: 01/15/23  7:05 PM   Specimen: BLOOD  Result Value Ref Range Status   Specimen Description   Final    BLOOD PORTA CATH Performed at [facility], [address], [place], [place] [address]    Special Requests   Final    BOTTLES DRAWN AEROBIC AND ANAEROBIC Blood Culture results may not be optimal due to an inadequate volume of blood received in culture bottles Performed at [facility], [address], [place], [place] [address]    Culture   Final    NO GROWTH 2 DAYS Performed at [facility], [address] [place], [place] [address]    Report Status PENDING  Incomplete  Blood Culture (routine x 2)     Status: None (Preliminary result)   Collection Time: 01/15/23  7:21 PM   Specimen: BLOOD  Result Value Ref Range Status   Specimen Description   Final    BLOOD LEFT ANTECUBITAL Performed at [facility], [address], [place], [place] [address]     Special Requests   Final    BOTTLES DRAWN AEROBIC AND ANAEROBIC Blood Culture adequate volume Performed at [facility], [address], [place], [place] [address]    Culture   Final    NO GROWTH 2 DAYS Performed at [facility], [address] [place], [place] [address]    Report Status PENDING  Incomplete     Labs: BNP (last 3 results) No results for input(s): "BNP" in the last 8760 hours. Basic Metabolic Panel: Recent Labs  Lab 01/15/23 1900 01/16/23 0845  NA 128* 135  K 3.4* 3.6  CL 98 106  CO2 21* 22  GLUCOSE 94 106*  BUN 6 <5*  CREATININE 0.82 0.80  CALCIUM 8.4* 8.2*   Liver Function Tests: Recent Labs  Lab 01/15/23 1900  AST 71*  ALT 28  ALKPHOS 62  BILITOT 1.1  PROT 8.8*  ALBUMIN 3.0*   No results for input(s): "LIPASE", "AMYLASE" in the last 168 hours. No results for input(s): "AMMONIA" in the last 168 hours. CBC: Recent Labs  Lab 01/15/23 1900 01/16/23 0845  WBC 13.6* 11.4*  NEUTROABS 8.4*  --   HGB 11.1* 10.1*  HCT 33.6* 31.0*  MCV 91.6 91.7  PLT 276 252   Cardiac Enzymes: No results for input(s): "CKTOTAL", "CKMB", "CKMBINDEX", "TROPONINI" in the last 168 hours. BNP: Invalid input(s): "POCBNP" CBG: No results for input(s): "GLUCAP" in the last 168 hours. D-Dimer No results for input(s): "DDIMER" in the last 72 hours. Hgb A1c No results for input(s): "HGBA1C" in the last 72 hours. Lipid Profile No results for input(s): "CHOL", "HDL", "LDLCALC", "TRIG", "CHOLHDL", "LDLDIRECT" in the last 72 hours. Thyroid function studies No results for input(s): "TSH", "T4TOTAL", "T3FREE", "THYROIDAB" in the last 72 hours.  Invalid input(s): "FREET3" Anemia work up No results for input(s): "VITAMINB12", "FOLATE", "FERRITIN", "TIBC", "IRON", "RETICCTPCT" in the last 72 hours. Urinalysis    Component Value Date/Time   COLORURINE YELLOW 01/15/2023 1900   APPEARANCEUR CLEAR 01/15/2023 1900   LABSPEC 1.003 (L) 01/15/2023 1900    PHURINE 7.0 01/15/2023 1900   GLUCOSEU NEGATIVE 01/15/2023 1900   HGBUR NEGATIVE 01/15/2023 1900   BILIRUBINUR NEGATIVE 01/15/2023 1900   BILIRUBINUR negative 01/15/2023 1619   KETONESUR NEGATIVE 01/15/2023 1900   KETONESUR negative 01/15/2023 1619   PROTEINUR NEGATIVE 01/15/2023 1900   PROTEINUR negative 01/15/2023 1619   UROBILINOGEN 1.0 01/15/2023 1619   NITRITE  NEGATIVE 01/15/2023 1900   NITRITE Negative 01/15/2023 1619   LEUKOCYTESUR NEGATIVE 01/15/2023 1900   LEUKOCYTESUR Negative 01/15/2023 1619   Sepsis Labs Recent Labs  Lab 01/15/23 1900 01/16/23 0845  WBC 13.6* 11.4*   Microbiology Recent Results (from the past 240 hour(s))  Resp panel by RT-PCR (RSV, Flu A&B, Covid) Anterior Nasal Swab     Status: None   Collection Time: 01/15/23  6:59 PM   Specimen: Anterior Nasal Swab  Result Value Ref Range Status   SARS Coronavirus 2 by RT PCR NEGATIVE NEGATIVE Final    Comment: (NOTE) SARS-CoV-2 target nucleic acids are NOT DETECTED.  The SARS-CoV-2 RNA is generally detectable in upper respiratory specimens during the acute phase of infection. The lowest concentration of SARS-CoV-2 viral copies this assay can detect is 138 copies/mL. A negative result does not preclude SARS-Cov-2 infection and should not be used as the sole basis for treatment or other patient management decisions. A negative result may occur with  improper specimen collection/handling, submission of specimen other than nasopharyngeal swab, presence of viral mutation(s) within the areas targeted by this assay, and inadequate number of viral copies(<138 copies/mL). A negative result must be combined with clinical observations, patient history, and epidemiological information. The expected result is Negative.  Fact Sheet for Patients:  BloggerCourse.com  Fact Sheet for Healthcare Providers:  SeriousBroker.it  This test is no t yet approved or cleared  by the [place] FDA and  has been authorized for detection and/or diagnosis of SARS-CoV-2 by FDA under an Emergency Use Authorization (EUA). This EUA will remain  in effect (meaning this test can be used) for the duration of the COVID-19 declaration under Section 564(b)(1) of the Act, 21 U.S.C.section 360bbb-3(b)(1), unless the authorization is terminated  or revoked sooner.       Influenza A by PCR NEGATIVE NEGATIVE Final   Influenza B by PCR NEGATIVE NEGATIVE Final    Comment: (NOTE) The Xpert Xpress SARS-CoV-2/FLU/RSV plus assay is intended as an aid in the diagnosis of influenza from Nasopharyngeal swab specimens and should not be used as a sole basis for treatment. Nasal washings and aspirates are unacceptable for Xpert Xpress SARS-CoV-2/FLU/RSV testing.  Fact Sheet for Patients: BloggerCourse.com  Fact Sheet for Healthcare Providers: SeriousBroker.it  This test is not yet approved or cleared by the [place] FDA and has been authorized for detection and/or diagnosis of SARS-CoV-2 by FDA under an Emergency Use Authorization (EUA). This EUA will remain in effect (meaning this test can be used) for the duration of the COVID-19 declaration under Section 564(b)(1) of the Act, 21 U.S.C. section 360bbb-3(b)(1), unless the authorization is terminated or revoked.     Resp Syncytial Virus by PCR NEGATIVE NEGATIVE Final    Comment: (NOTE) Fact Sheet for Patients: BloggerCourse.com  Fact Sheet for Healthcare Providers: SeriousBroker.it  This test is not yet approved or cleared by the [place] FDA and has been authorized for detection and/or diagnosis of SARS-CoV-2 by FDA under an Emergency Use Authorization (EUA). This EUA will remain in effect (meaning this test can be used) for the duration of the COVID-19 declaration under Section 564(b)(1) of the Act, 21  U.S.C. section 360bbb-3(b)(1), unless the authorization is terminated or revoked.  Performed at [facility], [address] [place], [place] [address]   Blood Culture (routine x 2)     Status: None (Preliminary result)   Collection Time: 01/15/23  7:05 PM   Specimen: BLOOD  Result Value Ref Range Status  Specimen Description   Final    BLOOD PORTA CATH Performed at [facility], [address], [place], [place] [address]    Special Requests   Final    BOTTLES DRAWN AEROBIC AND ANAEROBIC Blood Culture results may not be optimal due to an inadequate volume of blood received in culture bottles Performed at [facility], [address] [place], [place] [address]    Culture   Final    NO GROWTH 2 DAYS Performed at [facility], [address] [place], [place] [address]    Report Status PENDING  Incomplete  Blood Culture (routine x 2)     Status: None (Preliminary result)   Collection Time: 01/15/23  7:21 PM   Specimen: BLOOD  Result Value Ref Range Status   Specimen Description   Final    BLOOD LEFT ANTECUBITAL Performed at [facility], [address] [place], [place] [address]    Special Requests   Final    BOTTLES DRAWN AEROBIC AND ANAEROBIC Blood Culture adequate volume Performed at [facility], [address] [place], [place] [address]    Culture   Final    NO GROWTH 2 DAYS Performed at [facility], [address], [place], [place] [address]    Report Status PENDING  Incomplete    Please note: You were cared for by a hospitalist during your hospital stay. Once you are discharged, your primary care physician will handle any further medical issues. Please note that NO REFILLS for any discharge medications will be authorized once you are discharged, as it is imperative that you return to your primary care physician (or establish a relationship with a primary  care physician if you do not have one) for your post hospital discharge needs so that they can reassess your need for medications and monitor your lab values.    Time coordinating discharge: 40 minutes  SIGNED:   Burnadette Pop, MD  [facility] 01/17/2023, 9:37 AM Pager [phone]  If 7PM-7AM, please contact night-coverage [email or web address] Password [email or web address]

## 2023-01-17 NOTE — Procedures (Signed)
Modified Barium Swallow Study  Patient Details  Name: Maria Monroe MRN: [id number] Date of Birth: 01-11-1971  Today's Date: 01/17/2023  Modified Barium Swallow completed.  Full report located under Chart Review in the Imaging Section.  History of Present Illness Maria Monroe is a 52 y.o. 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 CT abd/pelvis showed multifocal pneumonia.   Clinical Impression Patient is presenting with an oropharyngeal swallow that is WFL-WNL. Swallow was initiated at posterior angle of the ramus and all boluses transited fully with no oral or pharyngeal residuals observed. Mastication and anterior to posterior transit of graham cracker with pudding thick barium was delayed but suspect this is due to patient's xerostomia s/p chemoradiation treatments. Patient herself reported that some foods like meats, she cannot chew up and swallow without difficulty so she avoids problematic foods. SLP did note prominent cricopharyngeal bar, however it did not appear to reduce flow of barium through PES. No observed barium stasis or retrograde movement of barium. Patient with very good and timely epiglottic inversion and laryngeal vestibule closure. SLP recommending patient continue with current PO diet and no further skilled assessment or intervention needed with aspiration risk being very minimal. Factors that may increase risk of adverse event in presence of aspiration Rubye Oaks & Clearance Coots 2021):    Swallow Evaluation Recommendations Recommendations: PO diet PO Diet Recommendation: Regular;Thin liquids (Level 0) Liquid Administration via: Cup;Straw Medication Administration: Other (Comment) (as tolerated) Supervision: Patient able to self-feed Swallowing strategies  :  Small bites/sips Postural changes: Position pt fully upright for meals Oral care recommendations: Pt independent with oral care;Oral care BID (2x/day)     Angela Nevin, MA, CCC-SLP Speech Therapy

## 2023-01-17 NOTE — TOC Transition Note (Signed)
Transition of Care [facility]) - CM/SW Discharge Note  Patient Details  Name: Maria Monroe MRN: [id number] Date of Birth: 18-Jan-1971  Transition of Care [facility]) CM/SW Contact:  Ewing Schlein, LCSW Phone Number: 01/17/2023, 10:44 AM  Clinical Narrative: TOC notified patient will need a nebulizer. Patient agreeable to referral to [employer]. CSW made DME referral to [facility] with [employer]. [employer] to deliver nebulizer to patient's room. TOC signing off.  Final next level of care: Home/Self Care Barriers to Discharge: No Barriers Identified  Patient Goals and CMS Choice CMS Medicare.gov Compare Post Acute Care list provided to:: Patient Choice offered to / list presented to : Patient  Discharge Plan and Services Additional resources added to the After Visit Summary for         DME Arranged: Nebulizer machine DME Agency: [employer] Date DME Agency Contacted: 01/17/23 Time DME Agency Contacted: 1040 Representative spoke with at DME Agency: Vaughan Basta  Social Determinants of Health (SDOH) Interventions SDOH Screenings   Food Insecurity: No Food Insecurity (01/16/2023)  Housing: Patient Declined (01/16/2023)  Transportation Needs: No Transportation Needs (01/16/2023)  Utilities: Not At Risk (01/16/2023)  Tobacco Use: Medium Risk (01/15/2023)   Readmission Risk Interventions     No data to display

## 2023-01-17 NOTE — Progress Notes (Signed)
Discharge education completed and medications reviewed with pt. No questions or concerns at this time. Pt discharged home with all belongings and nebulizer.

## 2023-01-19 LAB — CULTURE, BLOOD (ROUTINE X 2)

## 2023-01-20 LAB — CULTURE, BLOOD (ROUTINE X 2): Culture: NO GROWTH

## 2024-02-23 ENCOUNTER — Emergency Department (HOSPITAL_COMMUNITY)
Admission: EM | Admit: 2024-02-23 | Discharge: 2024-02-24 | Disposition: A | Discharge status: Home / Self Care | Attending: Emergency Medicine | Admitting: Emergency Medicine

## 2024-02-23 ENCOUNTER — Other Ambulatory Visit: Payer: Self-pay

## 2024-02-23 ENCOUNTER — Emergency Department (HOSPITAL_COMMUNITY)

## 2024-02-23 DIAGNOSIS — R042 Hemoptysis: Secondary | ICD-10-CM | POA: Diagnosis present

## 2024-02-23 DIAGNOSIS — Z79899 Other long term (current) drug therapy: Secondary | ICD-10-CM | POA: Diagnosis not present

## 2024-02-23 DIAGNOSIS — J471 Bronchiectasis with (acute) exacerbation: Secondary | ICD-10-CM | POA: Diagnosis not present

## 2024-02-23 DIAGNOSIS — I10 Essential (primary) hypertension: Secondary | ICD-10-CM | POA: Insufficient documentation

## 2024-02-23 DIAGNOSIS — Z853 Personal history of malignant neoplasm of breast: Secondary | ICD-10-CM | POA: Insufficient documentation

## 2024-02-23 DIAGNOSIS — D72829 Elevated white blood cell count, unspecified: Secondary | ICD-10-CM | POA: Insufficient documentation

## 2024-02-23 LAB — I-STAT CHEM 8, ED
BUN: 10 mg/dL (ref 6–20)
Calcium, Ion: 1.14 mmol/L — ABNORMAL LOW (ref 1.15–1.40)
Chloride: 106 mmol/L (ref 98–111)
Creatinine, Ser: 0.8 mg/dL (ref 0.44–1.00)
Glucose, Bld: 97 mg/dL (ref 70–99)
HCT: 34 % — ABNORMAL LOW (ref 36.0–46.0)
Hemoglobin: 11.6 g/dL — ABNORMAL LOW (ref 12.0–15.0)
Potassium: 4.1 mmol/L (ref 3.5–5.1)
Sodium: 141 mmol/L (ref 135–145)
TCO2: 23 mmol/L (ref 22–32)

## 2024-02-23 LAB — CBC WITH DIFFERENTIAL/PLATELET
Abs Immature Granulocytes: 0.05 10*3/uL (ref 0.00–0.07)
Basophils Absolute: 0.1 10*3/uL (ref 0.0–0.1)
Basophils Relative: 1 %
Eosinophils Absolute: 0.3 10*3/uL (ref 0.0–0.5)
Eosinophils Relative: 2 %
HCT: 34.5 % — ABNORMAL LOW (ref 36.0–46.0)
Hemoglobin: 11.3 g/dL — ABNORMAL LOW (ref 12.0–15.0)
Immature Granulocytes: 0 %
Lymphocytes Relative: 23 %
Lymphs Abs: 2.5 10*3/uL (ref 0.7–4.0)
MCH: 30.7 pg (ref 26.0–34.0)
MCHC: 32.8 g/dL (ref 30.0–36.0)
MCV: 93.8 fL (ref 80.0–100.0)
Monocytes Absolute: 0.6 10*3/uL (ref 0.1–1.0)
Monocytes Relative: 5 %
Neutro Abs: 7.7 10*3/uL (ref 1.7–7.7)
Neutrophils Relative %: 69 %
Platelets: 236 10*3/uL (ref 150–400)
RBC: 3.68 MIL/uL — ABNORMAL LOW (ref 3.87–5.11)
RDW: 13.8 % (ref 11.5–15.5)
WBC: 11.2 10*3/uL — ABNORMAL HIGH (ref 4.0–10.5)
nRBC: 0 % (ref 0.0–0.2)

## 2024-02-23 LAB — TROPONIN I (HIGH SENSITIVITY)
Troponin I (High Sensitivity): 7 ng/L (ref <18)
Troponin I (High Sensitivity): 4 ng/L (ref <18)

## 2024-02-23 LAB — BASIC METABOLIC PANEL WITH GFR
Anion gap: 11 (ref 5–15)
BUN: 11 mg/dL (ref 6–20)
CO2: 23 mmol/L (ref 22–32)
Calcium: 9.5 mg/dL (ref 8.9–10.3)
Chloride: 105 mmol/L (ref 98–111)
Creatinine, Ser: 0.77 mg/dL (ref 0.44–1.00)
GFR, Estimated: >60 mL/min (ref >60)
Glucose, Bld: 99 mg/dL (ref 70–99)
Potassium: 4.2 mmol/L (ref 3.5–5.1)
Sodium: 139 mmol/L (ref 135–145)

## 2024-02-23 MED ORDER — CARVEDILOL 12.5 MG PO TABS
25.0000 mg | ORAL_TABLET | Freq: Two times a day (BID) | ORAL | Status: DC
  Administered 2024-02-23: 25 mg via ORAL
  Filled 2024-02-23: qty 2

## 2024-02-23 MED ORDER — IOHEXOL 350 MG/ML SOLN
75.0000 mL | Freq: Once | INTRAVENOUS | Status: AC | PRN
  Administered 2024-02-23: 75 mL via INTRAVENOUS

## 2024-02-23 MED ORDER — DOXYCYCLINE HYCLATE 100 MG PO CAPS: 100.0000 mg | ORAL_CAPSULE | Freq: Two times a day (BID) | ORAL | 0 refills | Status: DC

## 2024-02-23 MED ORDER — DOXYCYCLINE HYCLATE 100 MG PO TABS
100.0000 mg | ORAL_TABLET | Freq: Once | ORAL | Status: AC
  Administered 2024-02-23: 100 mg via ORAL
  Filled 2024-02-23: qty 1

## 2024-02-23 MED ORDER — SACUBITRIL-VALSARTAN 24-26 MG PO TABS
1.0000 | ORAL_TABLET | Freq: Once | ORAL | Status: AC
  Administered 2024-02-23: 1 via ORAL
  Filled 2024-02-23: qty 1

## 2024-02-23 NOTE — ED Triage Notes (Signed)
 Patient reports hemoptysis x2 today , denies SOB , no anticoagulant medication , currently receiving chemotherapy at [facility] for lung cancer .

## 2024-02-23 NOTE — ED Provider Notes (Signed)
 [place] EMERGENCY DEPARTMENT AT [facility] Provider Note   CSN: [id number] Arrival date & time: 02/23/24  8069     Patient presents with: Hemoptysis   Maria Monroe is a 53 y.o. female.   The history is provided by the patient.  She has history of hypertension, metastatic breast cancer and comes in because she started coughing up blood today.  She had a mild cough yesterday which got worse today.  She denies fever, chills, sweats.  She denies shortness of breath.  She denies any chest pain.   Prior to Admission medications   Medication Sig Start Date End Date Taking? Authorizing Provider  azelastine  (ASTELIN ) 0.1 % nasal spray Place 2 sprays into both nostrils 4 (four) times daily as needed for rhinitis (or inflammation). Use in each nostril as directed    [provider]  carvedilol  (COREG ) 25 MG tablet Take 25 mg by mouth 2 (two) times daily. 12/23/21   [provider]  ipratropium-albuterol  (DUONEB) 0.5-2.5 (3) MG/3ML SOLN Take 3 mLs by nebulization every 6 (six) hours as needed. 01/17/23   Adhikari, Amrit, MD  Multiple Vitamin (MULTIVITAMIN) tablet Take 1 tablet by mouth daily.    [provider]  oxyCODONE  (OXY IR/ROXICODONE ) 5 MG immediate release tablet Take 5 mg by mouth every 4 (four) hours as needed for severe pain.    [provider]  sacubitril -valsartan  (ENTRESTO ) 24-26 MG Take 1 tablet by mouth 2 (two) times daily.    [provider]    Allergies: Asa [aspirin], Tomato, Citric acid, Corn-containing products, Prednisone , and Relafen [nabumetone]    Review of Systems  All other systems reviewed and are negative.   Updated Vital Signs BP (!) 132/97   Pulse (!) 105   Temp 98 F (36.7 C) (Oral)   Resp 20   SpO2 100%   Physical Exam Vitals and nursing note reviewed.   53 year old female, resting comfortably and in no acute distress. Vital signs are significant for mildly elevated blood pressure and  mildly elevated heart rate. Oxygen saturation is 100%, which is normal. Head is normocephalic and atraumatic. PERRLA, EOMI.  Back is nontender and there is no CVA tenderness. Lungs are clear without rales, wheezes, or rhonchi. Chest: Status post bilateral mastectomy. Heart has regular rate and rhythm without murmur. Abdomen is soft, flat, nontender. Skin is warm and dry without rash. Neurologic: Awake and alert, moves all extremities equally.  (all labs ordered are listed, but only abnormal results are displayed) Labs Reviewed  CBC WITH DIFFERENTIAL/PLATELET - Abnormal; Notable for the following components:      Result Value   WBC 11.2 (*)    RBC 3.68 (*)    Hemoglobin 11.3 (*)    HCT 34.5 (*)    All other components within normal limits  I-STAT CHEM 8, ED - Abnormal; Notable for the following components:   Calcium, Ion 1.14 (*)    Hemoglobin 11.6 (*)    HCT 34.0 (*)    All other components within normal limits  BASIC METABOLIC PANEL WITH GFR  TROPONIN I (HIGH SENSITIVITY)  TROPONIN I (HIGH SENSITIVITY)    EKG: EKG Interpretation Date/Time:  Tuesday February 23 2024 20:03:02 EDT Ventricular Rate:  99 PR Interval:  180 QRS Duration:  84 QT Interval:  328 QTC Calculation: 420 R Axis:   17  Text Interpretation: Normal sinus rhythm Moderate voltage criteria for LVH, may be normal variant ( Sokolow-Lyon , Cornell product ) Borderline ECG Confirmed by  Franklyn Gills [phone]) on 02/23/2024 10:08:06 PM  Radiology: CT Angio Chest PE W and/or Wo Contrast Result Date: 02/23/2024 CLINICAL DATA:  Hemoptysis x2 today, history of lung carcinoma EXAM: CT ANGIOGRAPHY CHEST WITH CONTRAST TECHNIQUE: Multidetector CT imaging of the chest was performed using the standard protocol during bolus administration of intravenous contrast. Multiplanar CT image reconstructions and MIPs were obtained to evaluate the vascular anatomy. RADIATION DOSE REDUCTION: This exam was performed according to the  departmental dose-optimization program which includes automated exposure control, adjustment of the mA and/or kV according to patient size and/or use of iterative reconstruction technique. CONTRAST:  75mL OMNIPAQUE  IOHEXOL  350 MG/ML SOLN COMPARISON:  04/13/2022, chest x-ray from earlier in the same day. FINDINGS: Cardiovascular: Thoracic aorta shows a normal branching pattern. Right chest wall port is noted in satisfactory position. Heart is mildly enlarged in size. The pulmonary artery is truncated particularly in the right upper lobe secondary to changes related to prior lung carcinoma. No significant coronary calcifications are noted. Mediastinum/Nodes: Gastric inlet is within normal limits. No hilar or mediastinal adenopathy is noted. Esophagus as visualized is within normal limits. Lungs/Pleura: Poorly marginated confluent density is noted in the right paratracheal region with some spiculation identified. It appears larger (at least 3.2 cm in greatest dimension. This previously measured approximately 13 mm in greatest dimension) particularly on image number 43 of series 6 suspicious for recurrence. Bronchiectatic changes are noted throughout the right middle and right lower lobes which account for the cystic changes on recent chest x-ray. A soft tissue mass is noted in the medial aspect of the left upper lobe measuring up to 16 mm. This has increased significantly in the interval from the prior exam consistent with metastatic disease. No other parenchymal nodules are noted at this time. Upper Abdomen: Visualized upper abdomen is within normal limits. Ribcage is unremarkable. Musculoskeletal: Stable sclerosis is noted at T2 and C7 consistent with prior metastatic disease Review of the MIP images confirms the above findings. IMPRESSION: Increasing soft tissue density in the right perihilar region suspicious for recurrence. New soft tissue nodule measuring up to 16 mm in the medial left upper lobe consistent with  metastatic disease to a proven otherwise. PET-CT would be helpful for further evaluation. Electronically Signed   By: Oneil Devonshire M.D.   On: 02/23/2024 21:27   DG Chest 2 View Result Date: 02/23/2024 CLINICAL DATA:  Hemoptysis EXAM: CHEST - 2 VIEW COMPARISON:  01/15/2023 FINDINGS: Right Port-A-Cath in place with tip at the cavoatrial junction. Heart and mediastinal contours are within normal limits. New mixed lucent and airspace densities noted throughout much of the right lung. Air-fluid levels are noted anteriorly on the lateral view. No confluent opacity on the left. No visible effusions or acute bony abnormality. IMPRESSION: New mixed lucent and airspace densities noted throughout much of the right lung, particularly mid and lower lung with air-fluid levels noted on the lateral view. This could be further evaluated with chest CT. Electronically Signed   By: Franky Crease M.D.   On: 02/23/2024 20:27     Procedures  Cardiac monitor shows sinus tachycardia, per my interpretation. Medications Ordered in the ED  doxycycline (VIBRA-TABS) tablet 100 mg (has no administration in time range)  carvedilol  (COREG ) tablet 25 mg (has no administration in time range)  sacubitril -valsartan  (ENTRESTO ) 24-26 mg per tablet (has no administration in time range)  iohexol  (OMNIPAQUE ) 350 MG/ML injection 75 mL (75 mLs Intravenous Contrast Given 02/23/24 2116)  Medical Decision Making  Hemoptysis in patient with history of breast cancer.  This presentation has a wide range of treatment options and carries with it a high risk of morbidity and complications.  Differential diagnosis includes, but is not limited to, endobronchial tumor, AV malformation, necrotizing pneumonia, bronchiectasis.  I have reviewed her laboratory tests, my interpretation is mild leukocytosis which is nonspecific, stable anemia, normal troponin.  Chest x-ray shows new mixed lucent airspace densities throughout  much of the right lung with recommendation to follow-up with chest CT.  CT angiogram of chest shows increasing soft tissue density in the right perihilar region suspicious for recurrence of metastatic cancer, new soft tissue nodule measuring up to 16 mm in the medial left upper lobe consistent with metastatic disease, bronchiectatic changes in the right middle and lower lung which had not been present on previous chest CTs.  I have independently viewed all of these images, and agree with the radiologist's interpretation.  I suspect that her hemoptysis is secondary to bronchiectasis.  She does not have massive hemoptysis, I do not feel she needs hospitalization but she would benefit from urgent bronchoscopy.  I have ordered a dose of doxycycline and I am discharging her with a prescription for doxycycline.  I am recommending that she follow-up with her oncologist at [facility] where they can refer her to pulmonology there.  However, I am referring her to pulmonology here in case there is a delay in getting seen at [facility].  She is to return here if bleeding gets worse.     Final diagnoses:  Hemoptysis  Bronchiectasis with acute exacerbation [facility])    ED Discharge Orders          Ordered    doxycycline (VIBRAMYCIN) 100 MG capsule  2 times daily        02/23/24 2338    Ambulatory referral to Pulmonology       Comments: hemoptysis   02/23/24 2344          Hemoptysis in patient with history of breast cancer.  This is a presentation that carries with a wide range of treatment options and has a high risk of morbidity and complications.  JONETTA Raford Lenis, MD 02/23/24 [phone]

## 2024-02-23 NOTE — Discharge Instructions (Addendum)
 Contact your oncologist to see if you can get an immediate referral to a pulmonologist (lung specialist) at [facility].  If there is any delay in seeing a pulmonologist at [facility], you may see the pulmonologist with the [facility] system.  Return if the amount of bleeding is increasing.

## 2024-02-23 NOTE — ED Provider Triage Note (Signed)
 Emergency Medicine Provider Triage Evaluation Note  Maria Monroe , a 53 y.o. female  was evaluated in triage.  Pt complains of hemoptysis that started this morning.  She has had 2 episodes of 4.  Has history of breast cancer and is undergoing chemo and radiation.  Review of Systems  Positive: As above Negative: As above  Physical Exam  BP (!) 136/104 (BP Location: Left Leg)   Pulse 100   Temp 98 F (36.7 C) (Oral)   Resp 18   SpO2 100%  Gen:   Awake, no distress   Resp:  Normal effort  MSK:   Moves extremities without difficulty  Other:    Medical Decision Making  Medically screening exam initiated at 7:55 PM.  Appropriate orders placed.  Rashea Hoskie was informed that the remainder of the evaluation will be completed by another provider, this initial triage assessment does not replace that evaluation, and the importance of remaining in the ED until their evaluation is complete.     Hildegard Loge, PA-C 02/23/24 1956

## 2024-03-17 ENCOUNTER — Other Ambulatory Visit: Payer: Self-pay

## 2024-03-17 ENCOUNTER — Emergency Department (HOSPITAL_COMMUNITY)

## 2024-03-17 ENCOUNTER — Emergency Department (HOSPITAL_COMMUNITY)
Admission: EM | Admit: 2024-03-17 | Discharge: 2024-03-18 | Disposition: A | Discharge status: Other Acute Inpatient Hospital | Attending: Emergency Medicine | Admitting: Emergency Medicine

## 2024-03-17 DIAGNOSIS — Z853 Personal history of malignant neoplasm of breast: Secondary | ICD-10-CM | POA: Diagnosis not present

## 2024-03-17 DIAGNOSIS — R042 Hemoptysis: Secondary | ICD-10-CM | POA: Diagnosis present

## 2024-03-17 DIAGNOSIS — C50911 Malignant neoplasm of unspecified site of right female breast: Secondary | ICD-10-CM | POA: Diagnosis not present

## 2024-03-17 DIAGNOSIS — Z79899 Other long term (current) drug therapy: Secondary | ICD-10-CM | POA: Diagnosis not present

## 2024-03-17 DIAGNOSIS — J471 Bronchiectasis with (acute) exacerbation: Secondary | ICD-10-CM | POA: Insufficient documentation

## 2024-03-17 DIAGNOSIS — I10 Essential (primary) hypertension: Secondary | ICD-10-CM | POA: Insufficient documentation

## 2024-03-17 DIAGNOSIS — R000 Tachycardia, unspecified: Secondary | ICD-10-CM | POA: Diagnosis not present

## 2024-03-17 LAB — BASIC METABOLIC PANEL WITH GFR
Anion gap: 7 (ref 5–15)
BUN: 10 mg/dL (ref 6–20)
CO2: 25 mmol/L (ref 22–32)
Calcium: 9.1 mg/dL (ref 8.9–10.3)
Chloride: 107 mmol/L (ref 98–111)
Creatinine, Ser: 0.9 mg/dL (ref 0.44–1.00)
GFR, Estimated: >60 mL/min (ref >60)
Glucose, Bld: 98 mg/dL (ref 70–99)
Potassium: 4.8 mmol/L (ref 3.5–5.1)
Sodium: 139 mmol/L (ref 135–145)

## 2024-03-17 LAB — CBC WITH DIFFERENTIAL/PLATELET
Abs Immature Granulocytes: 0.01 K/uL (ref 0.00–0.07)
Basophils Absolute: 0.1 K/uL (ref 0.0–0.1)
Basophils Relative: 1 %
Eosinophils Absolute: 0.3 K/uL (ref 0.0–0.5)
Eosinophils Relative: 5 %
HCT: 35.7 % — ABNORMAL LOW (ref 36.0–46.0)
Hemoglobin: 11.2 g/dL — ABNORMAL LOW (ref 12.0–15.0)
Immature Granulocytes: 0 %
Lymphocytes Relative: 48 %
Lymphs Abs: 3 K/uL (ref 0.7–4.0)
MCH: 29.6 pg (ref 26.0–34.0)
MCHC: 31.4 g/dL (ref 30.0–36.0)
MCV: 94.4 fL (ref 80.0–100.0)
Monocytes Absolute: 0.6 K/uL (ref 0.1–1.0)
Monocytes Relative: 9 %
Neutro Abs: 2.3 K/uL (ref 1.7–7.7)
Neutrophils Relative %: 37 %
Platelets: 328 K/uL (ref 150–400)
RBC: 3.78 MIL/uL — ABNORMAL LOW (ref 3.87–5.11)
RDW: 13.9 % (ref 11.5–15.5)
Smear Review: NORMAL
WBC: 6.2 K/uL (ref 4.0–10.5)
nRBC: 0 % (ref 0.0–0.2)

## 2024-03-17 MED ORDER — IOHEXOL 350 MG/ML SOLN
75.0000 mL | Freq: Once | INTRAVENOUS | Status: AC | PRN
  Administered 2024-03-17: 75 mL via INTRAVENOUS

## 2024-03-17 NOTE — ED Provider Triage Note (Signed)
 Emergency Medicine Provider Triage Evaluation Note  Sylwia Cuervo , a 53 y.o. female  was evaluated in triage.  Pt complains of coughing up blood. Report coughing up once with blood clots in sputum.  Hx of lung CA currently on chemo. Oncologist wants r/o of PE.  Denies increased sob, fever, or chest pain.   Review of Systems  Positive: As above Negative: As above  Physical Exam  BP (!) 155/90 (BP Location: Left Arm)   Pulse 91   Temp 98.2 F (36.8 C) (Oral)   Resp 18   SpO2 100%  Gen:   Awake, no distress   Resp:  Normal effort  MSK:   Moves extremities without difficulty  Other:    Medical Decision Making  Medically screening exam initiated at 8:47 PM.  Appropriate orders placed.  Robby Pirani was informed that the remainder of the evaluation will be completed by another provider, this initial triage assessment does not replace that evaluation, and the importance of remaining in the ED until their evaluation is complete.     Nivia Colon, PA-C 03/17/24 2048

## 2024-03-17 NOTE — ED Triage Notes (Signed)
 Patient reports hemoptysis x1 this evening , respirations unlabored .

## 2024-03-18 ENCOUNTER — Encounter (HOSPITAL_COMMUNITY): Payer: Self-pay

## 2024-03-18 ENCOUNTER — Emergency Department (HOSPITAL_COMMUNITY)
Admission: EM | Admit: 2024-03-18 | Discharge: 2024-03-19 | Disposition: A | Discharge status: Other Acute Inpatient Hospital | Attending: Emergency Medicine | Admitting: Emergency Medicine

## 2024-03-18 DIAGNOSIS — I10 Essential (primary) hypertension: Secondary | ICD-10-CM | POA: Insufficient documentation

## 2024-03-18 DIAGNOSIS — R000 Tachycardia, unspecified: Secondary | ICD-10-CM | POA: Insufficient documentation

## 2024-03-18 DIAGNOSIS — R042 Hemoptysis: Secondary | ICD-10-CM | POA: Insufficient documentation

## 2024-03-18 DIAGNOSIS — Z853 Personal history of malignant neoplasm of breast: Secondary | ICD-10-CM | POA: Insufficient documentation

## 2024-03-18 DIAGNOSIS — J471 Bronchiectasis with (acute) exacerbation: Secondary | ICD-10-CM | POA: Diagnosis not present

## 2024-03-18 DIAGNOSIS — Z79899 Other long term (current) drug therapy: Secondary | ICD-10-CM | POA: Insufficient documentation

## 2024-03-18 LAB — CBC
HCT: 36.8 % (ref 36.0–46.0)
Hemoglobin: 11.4 g/dL — ABNORMAL LOW (ref 12.0–15.0)
MCH: 29.2 pg (ref 26.0–34.0)
MCHC: 31 g/dL (ref 30.0–36.0)
MCV: 94.1 fL (ref 80.0–100.0)
Platelets: 363 K/uL (ref 150–400)
RBC: 3.91 MIL/uL (ref 3.87–5.11)
RDW: 14.1 % (ref 11.5–15.5)
WBC: 13.4 K/uL — ABNORMAL HIGH (ref 4.0–10.5)
nRBC: 0 % (ref 0.0–0.2)

## 2024-03-18 LAB — TYPE AND SCREEN
ABO/RH(D): O POS
Antibody Screen: NEGATIVE

## 2024-03-18 LAB — BASIC METABOLIC PANEL WITH GFR
Anion gap: 10 (ref 5–15)
BUN: 9 mg/dL (ref 6–20)
CO2: 25 mmol/L (ref 22–32)
Calcium: 9.3 mg/dL (ref 8.9–10.3)
Chloride: 102 mmol/L (ref 98–111)
Creatinine, Ser: 0.97 mg/dL (ref 0.44–1.00)
GFR, Estimated: >60 mL/min (ref >60)
Glucose, Bld: 100 mg/dL — ABNORMAL HIGH (ref 70–99)
Potassium: 4.2 mmol/L (ref 3.5–5.1)
Sodium: 137 mmol/L (ref 135–145)

## 2024-03-18 MED ORDER — AMOXICILLIN-POT CLAVULANATE 875-125 MG PO TABS
1.0000 | ORAL_TABLET | Freq: Two times a day (BID) | ORAL | 0 refills | Status: AC
Start: 2024-03-18 — End: ?

## 2024-03-18 MED ORDER — AMOXICILLIN-POT CLAVULANATE 875-125 MG PO TABS
1.0000 | ORAL_TABLET | Freq: Once | ORAL | Status: AC
  Administered 2024-03-18: 1 via ORAL
  Filled 2024-03-18: qty 1

## 2024-03-18 NOTE — ED Notes (Signed)
 CCMD called.

## 2024-03-18 NOTE — ED Provider Notes (Signed)
 [place] EMERGENCY DEPARTMENT AT [facility] Provider Note   CSN: [id number] Arrival date & time: 03/17/24  2035     Patient presents with: Hemoptysis   Maria Monroe is a 53 y.o. female.   The history is provided by the patient.  She has history of hypertension, breast cancer with bone metastases and comes in with recurrent hemoptysis.  She had been seen in the emergency department on 6/24 for hemoptysis, given a prescription for doxycycline  and she had been doing well until tonight when she had recurrent hemoptysis.  She denies any chest pain and denies any shortness of breath.  She states that she was supposed to get into see the pulmonologist at [facility], but her cancer chemotherapy schedule was changed and the appointment was not able to be made.   Prior to Admission medications   Medication Sig Start Date End Date Taking? Authorizing Provider  amoxicillin -clavulanate (AUGMENTIN ) 875-125 MG tablet Take 1 tablet by mouth every 12 (twelve) hours. 03/18/24  Yes Raford Lenis, MD  azelastine  (ASTELIN ) 0.1 % nasal spray Place 2 sprays into both nostrils 4 (four) times daily as needed for rhinitis (or inflammation). Use in each nostril as directed    [provider]  carvedilol  (COREG ) 25 MG tablet Take 25 mg by mouth 2 (two) times daily. 12/23/21   [provider]  ipratropium-albuterol  (DUONEB) 0.5-2.5 (3) MG/3ML SOLN Take 3 mLs by nebulization every 6 (six) hours as needed. 01/17/23   Adhikari, Amrit, MD  Multiple Vitamin (MULTIVITAMIN) tablet Take 1 tablet by mouth daily.    [provider]  oxyCODONE  (OXY IR/ROXICODONE ) 5 MG immediate release tablet Take 5 mg by mouth every 4 (four) hours as needed for severe pain.    [provider]  sacubitril -valsartan  (ENTRESTO ) 24-26 MG Take 1 tablet by mouth 2 (two) times daily.    [provider]    Allergies: Asa [aspirin], Tomato, Citric acid, Corn-containing products, Prednisone , and  Relafen [nabumetone]    Review of Systems  All other systems reviewed and are negative.   Updated Vital Signs BP (!) 148/101   Pulse 80   Temp 98.8 F (37.1 C)   Resp 19   SpO2 100%   Physical Exam Vitals and nursing note reviewed.   53 year old female, resting comfortably and in no acute distress. Vital signs are significant for elevated blood pressure. Oxygen saturation is 100%, which is normal. Head is normocephalic and atraumatic. PERRLA, EOMI.  Lungs have few rales at the right base without wheezes or rhonchi. Chest is nontender. Heart has regular rate and rhythm without murmur. Abdomen is soft, flat, nontender. Extremities have no cyanosis or edema. Skin is warm and dry without rash. Neurologic: Awake and alert, moves all extremities equally.  (all labs ordered are listed, but only abnormal results are displayed) Labs Reviewed  CBC WITH DIFFERENTIAL/PLATELET - Abnormal; Notable for the following components:      Result Value   RBC 3.78 (*)    Hemoglobin 11.2 (*)    HCT 35.7 (*)    All other components within normal limits  BASIC METABOLIC PANEL WITH GFR    EKG: EKG Interpretation Date/Time:  Thursday March 17 2024 21:02:50 EDT Ventricular Rate:  88 PR Interval:  180 QRS Duration:  80 QT Interval:  346 QTC Calculation: 418 R Axis:   39  Text Interpretation: Normal sinus rhythm Minimal voltage criteria for LVH, may be normal variant ( Sokolow-Lyon ) Borderline ECG When compared with ECG of  23-Feb-2024 20:03, No significant change was found Confirmed by Raford Lenis ([id number]) on 03/18/2024 2:42:17 AM  Radiology: CT Angio Chest PE W and/or Wo Contrast Result Date: 03/17/2024 CLINICAL DATA:  High probability for PE. Cancer. Hemoptysis. CT PE protocol 02/23/2024. EXAM: CT ANGIOGRAPHY CHEST WITH CONTRAST TECHNIQUE: Multidetector CT imaging of the chest was performed using the standard protocol during bolus administration of intravenous contrast. Multiplanar CT image  reconstructions and MIPs were obtained to evaluate the vascular anatomy. RADIATION DOSE REDUCTION: This exam was performed according to the departmental dose-optimization program which includes automated exposure control, adjustment of the mA and/or kV according to patient size and/or use of iterative reconstruction technique. CONTRAST:  75mL OMNIPAQUE  IOHEXOL  350 MG/ML SOLN COMPARISON:  CT angiogram chest 02/23/2024. FINDINGS: Cardiovascular: There is attenuated flow throughout all pulmonary arteries in the right lung. There is normal flow identified throughout the left lung pulmonary arteries. There is no central pulmonary embolism. The heart is enlarged. There is no pericardial effusion. Aorta is normal in size. Right chest port catheter tip ends in the right atrium. Mediastinum/Nodes: Right paratracheal lymphadenopathy measuring 2.3 x 2.2 cm appears unchanged. Subcarinal lymphadenopathy measuring up to 15 mm has increased. Esophagus is nondilated. Visualized thyroid gland is within normal limits. Lungs/Pleura: Spiculated right upper lobe and perihilar mass has not significantly changed. Bronchial occlusion of the right upper lobe appears unchanged from prior. Left upper lobe nodule measuring 15 mm appears unchanged from prior. Extensive bronchiectasis and cystic change throughout the right mid and lower lungs unchanged from prior. Left lung is otherwise clear. There is no pleural effusion or pneumothorax. Upper Abdomen: No acute abnormality. Musculoskeletal: No chest wall abnormality. No acute or significant osseous findings. Review of the MIP images confirms the above findings. IMPRESSION: 1. No central pulmonary embolism. 2. Stable attenuated flow throughout all pulmonary arteries in the right lung. Findings are indeterminate and may be related to timing of the contrast bolus for patient's tumor mass. Segmental and subsegmental pulmonary emboli would be difficult to exclude. 3. Stable spiculated right upper  lobe and perihilar mass. 4. Stable left upper lobe nodule. 5. Stable right paratracheal lymphadenopathy. 6. Increased subcarinal lymphadenopathy. 7. Stable extensive bronchiectasis and cystic change throughout the right mid and lower lungs. Electronically Signed   By: Greig Pique M.D.   On: 03/17/2024 23:04     Procedures   Medications Ordered in the ED  amoxicillin -clavulanate (AUGMENTIN ) 875-125 MG per tablet 1 tablet (has no administration in time range)  iohexol  (OMNIPAQUE ) 350 MG/ML injection 75 mL (75 mLs Intravenous Contrast Given 03/17/24 2246)    Clinical Course as of 03/18/24 0656  Fri Mar 18, 2024  0542 CT Angio Chest PE W and/or Wo Contrast [CG]    Clinical Course User Index [CG] Ruthell Lonni FALCON, PA-C                                 Medical Decision Making Risk Prescription drug management.   Hemoptysis and patient with lung cancer and previously diagnosed bronchiectasis.  I reviewed her laboratory tests, and my interpretation is normal basic metabolic panel, stable anemia, normal platelet count.  CT angiogram showed no pulmonary embolism, stable right upper lobe and perihilar mass, stable left upper lobe nodule, stable right paratracheal lymphadenopathy, increased subcarinal lymphadenopathy, stable extensive bronchiectasis through the right mid and lower lungs.  I have independently viewed these images, and agree with radiologist's interpretation.  I have reviewed her  past records and do note ED visit on 6/24 for hemoptysis with findings of bronchiectasis on CT scan and treated with a course of oral doxycycline .  Once again, I feel that bronchiectasis is the most likely source of her hemoptysis and it is significant that hemoptysis recurred rather soon after completing course of antibiotics.  She was on doxycycline  previously.  I feel that it would be prudent to change antibiotics I am discharging her with a prescription for amoxicillin -clavulanate and I am giving her an  ambulatory referral to pulmonology.  She would likely benefit from bronchoscopy and, if a bleeding site is identified, photocoagulation.     Final diagnoses:  Hemoptysis  Bronchiectasis with acute exacerbation (HCC)  Malignant neoplasm of right breast in female, estrogen receptor positive, unspecified site of breast [facility])    ED Discharge Orders          Ordered    Ambulatory referral to Pulmonology       Comments: Hemoptysis, bronchiectasis   03/18/24 0654    amoxicillin -clavulanate (AUGMENTIN ) 875-125 MG tablet  Every 12 hours        03/18/24 0655               Raford Lenis, MD 03/18/24 [phone]

## 2024-03-18 NOTE — ED Triage Notes (Signed)
 Pt bib EMS for coughing up blood yesterday evening. 1 clot then it increased. Seen by [facility] this morning. Edger Sayer MD said she wanted pt to come back in and have spot in lung cauterized. If [facility] wont do it Edger Sayer MD at [facility] wants her sent to her. She is working [employer]. No pain no SOB. Pt is usually care for by Internal Medicine at [facility].

## 2024-03-18 NOTE — Discharge Instructions (Addendum)
 You need to be evaluated by a lung specialist.  Please call the pulmonology office for an appointment as soon as possible.  Return to the emergency department if bleeding seems to be getting worse or if you are developing difficulty breathing.

## 2024-03-18 NOTE — ED Provider Notes (Signed)
 [place] EMERGENCY DEPARTMENT AT [facility] Provider Note   CSN: [id number] Arrival date & time: 03/18/24  8096     Patient presents with: lung problem   Maria Monroe is a 53 y.o. female.   HPI   Patient presents to the ED for evaluation of hemoptysis.  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  Patient was seen in the emergency room yesterday.  She was also previously seen on June 24 for similar symptoms.  Patient states she has been coughing up blood.  She has not had any particular chest pain or fevers.  Is not feeling short of breath.  Patient was seen in the emergency room yesterday at which time she had ED workup including laboratory tests and a CT scan.  Patient CT scan did not show any pulmonary embolism.  Patient had a stable lung mass noted.  She also had extensive bronchiectasis.  Patient was started antibiotics on discharge.  Patient states she spoke to her oncologist Dr. Dominick at [facility].  They felt she needed to have possible cauterization of the area causing her hemoptysis.  Patient was told to come to the ED to be admitted to [facility].  Prior to Admission medications   Medication Sig Start Date End Date Taking? Authorizing Provider  amoxicillin -clavulanate (AUGMENTIN ) 875-125 MG tablet Take 1 tablet by mouth every 12 (twelve) hours. 03/18/24   Raford Lenis, MD  azelastine  (ASTELIN ) 0.1 % nasal spray Place 2 sprays into both nostrils 4 (four) times daily as needed for rhinitis (or inflammation). Use in each nostril as directed    [provider]  carvedilol  (COREG ) 25 MG tablet Take 25 mg by mouth 2 (two) times daily. 12/23/21   [provider]  ipratropium-albuterol  (DUONEB) 0.5-2.5 (3) MG/3ML SOLN Take 3 mLs by nebulization every 6 (six) hours as needed. 01/17/23   Adhikari, Amrit, MD  Multiple Vitamin (MULTIVITAMIN) tablet Take 1 tablet by mouth daily.    [provider]   oxyCODONE  (OXY IR/ROXICODONE ) 5 MG immediate release tablet Take 5 mg by mouth every 4 (four) hours as needed for severe pain.    [provider]  sacubitril -valsartan  (ENTRESTO ) 24-26 MG Take 1 tablet by mouth 2 (two) times daily.    [provider]    Allergies: Asa [aspirin], Tomato, Citric acid, Corn-containing products, Prednisone , and Relafen [nabumetone]    Review of Systems  Updated Vital Signs BP 122/82   Pulse (!) 102   Temp 98.6 F (37 C)   Resp 19   Ht 1.651 m (5' 5)   Wt 99.8 kg   SpO2 99%   BMI 36.61 kg/m   Physical Exam Vitals and nursing note reviewed.  Constitutional:      General: She is not in acute distress.    Appearance: She is well-developed.  HENT:     Head: Normocephalic and atraumatic.     Right Ear: External ear normal.     Left Ear: External ear normal.  Eyes:     General: No scleral icterus.       Right eye: No discharge.        Left eye: No discharge.     Conjunctiva/sclera: Conjunctivae normal.  Neck:     Trachea: No tracheal deviation.  Cardiovascular:     Rate and Rhythm: Regular rhythm. Tachycardia present.  Pulmonary:     Effort: Pulmonary effort is normal. No respiratory distress.     Breath sounds: Normal  breath sounds. No stridor. No wheezing or rales.  Abdominal:     General: Bowel sounds are normal. There is no distension.     Palpations: Abdomen is soft.     Tenderness: There is no abdominal tenderness. There is no guarding or rebound.  Musculoskeletal:        General: No tenderness or deformity.     Cervical back: Neck supple.  Skin:    General: Skin is warm and dry.     Findings: No rash.  Neurological:     General: No focal deficit present.     Mental Status: She is alert.     Cranial Nerves: No cranial nerve deficit, dysarthria or facial asymmetry.     Sensory: No sensory deficit.     Motor: No abnormal muscle tone or seizure activity.     Coordination: Coordination normal.  Psychiatric:         Mood and Affect: Mood normal.     (all labs ordered are listed, but only abnormal results are displayed) Labs Reviewed  CBC - Abnormal; Notable for the following components:      Result Value   WBC 13.4 (*)    Hemoglobin 11.4 (*)    All other components within normal limits  BASIC METABOLIC PANEL WITH GFR - Abnormal; Notable for the following components:   Glucose, Bld 100 (*)    All other components within normal limits  TYPE AND SCREEN  ABO/RH    EKG: None  Radiology: CT Angio Chest PE W and/or Wo Contrast Result Date: 03/17/2024 CLINICAL DATA:  High probability for PE. Cancer. Hemoptysis. CT PE protocol 02/23/2024. EXAM: CT ANGIOGRAPHY CHEST WITH CONTRAST TECHNIQUE: Multidetector CT imaging of the chest was performed using the standard protocol during bolus administration of intravenous contrast. Multiplanar CT image reconstructions and MIPs were obtained to evaluate the vascular anatomy. RADIATION DOSE REDUCTION: This exam was performed according to the departmental dose-optimization program which includes automated exposure control, adjustment of the mA and/or kV according to patient size and/or use of iterative reconstruction technique. CONTRAST:  75mL OMNIPAQUE  IOHEXOL  350 MG/ML SOLN COMPARISON:  CT angiogram chest 02/23/2024. FINDINGS: Cardiovascular: There is attenuated flow throughout all pulmonary arteries in the right lung. There is normal flow identified throughout the left lung pulmonary arteries. There is no central pulmonary embolism. The heart is enlarged. There is no pericardial effusion. Aorta is normal in size. Right chest port catheter tip ends in the right atrium. Mediastinum/Nodes: Right paratracheal lymphadenopathy measuring 2.3 x 2.2 cm appears unchanged. Subcarinal lymphadenopathy measuring up to 15 mm has increased. Esophagus is nondilated. Visualized thyroid gland is within normal limits. Lungs/Pleura: Spiculated right upper lobe and perihilar mass has not  significantly changed. Bronchial occlusion of the right upper lobe appears unchanged from prior. Left upper lobe nodule measuring 15 mm appears unchanged from prior. Extensive bronchiectasis and cystic change throughout the right mid and lower lungs unchanged from prior. Left lung is otherwise clear. There is no pleural effusion or pneumothorax. Upper Abdomen: No acute abnormality. Musculoskeletal: No chest wall abnormality. No acute or significant osseous findings. Review of the MIP images confirms the above findings. IMPRESSION: 1. No central pulmonary embolism. 2. Stable attenuated flow throughout all pulmonary arteries in the right lung. Findings are indeterminate and may be related to timing of the contrast bolus for patient's tumor mass. Segmental and subsegmental pulmonary emboli would be difficult to exclude. 3. Stable spiculated right upper lobe and perihilar mass. 4. Stable left upper lobe nodule. 5.  Stable right paratracheal lymphadenopathy. 6. Increased subcarinal lymphadenopathy. 7. Stable extensive bronchiectasis and cystic change throughout the right mid and lower lungs. Electronically Signed   By: Greig Pique M.D.   On: 03/17/2024 23:04     Procedures   Medications Ordered in the ED - No data to display  Clinical Course as of 03/18/24 2353  Fri Mar 18, 2024  2126 CBC(!) Hemoglobin decreased at 11.4, stable compared to previous [JK]  2150 Basic metabolic panel(!) Metabolic panel normal [JK]    Clinical Course User Index [JK] Randol Simmonds, MD                                 Medical Decision Making Problems Addressed: Hemoptysis: acute illness or injury that poses a threat to life or bodily functions  Amount and/or Complexity of Data Reviewed Labs: ordered. Decision-making details documented in ED Course.   Patient presented to the ED for evaluation of recurrent hemoptysis.  Patient has been having episodes over the last couple of weeks.  She had a recurrent episode yesterday  and was seen in the emergency room.  Patient had laboratory test and a CT scan.  Her CT scan showed evidence of bronchiectasis as well as her known lung mass.  Pt was discharged with abx, plan for outpt follow up.  Patient states she had another episode today.  In the ED she has remained stable.  No recurrent hemoptysis.  She has had a mild tachycardia.  Her labs are otherwise stable.    Repeat imaging deferred as this was just done within the last 24 hours.  I have consulted with [facility] as that is where patient receives care.  Plan on transfer to [facility] for further evaluation of her recurrent hemoptysis.  Accepting physician is Dr. Jayson Cedar     Final diagnoses:  Hemoptysis    ED Discharge Orders     None          Randol Simmonds, MD 03/18/24 2353

## 2024-03-19 DIAGNOSIS — R042 Hemoptysis: Secondary | ICD-10-CM | POA: Diagnosis not present

## 2024-03-19 DIAGNOSIS — Z7401 Bed confinement status: Secondary | ICD-10-CM | POA: Diagnosis not present

## 2024-04-15 ENCOUNTER — Encounter: Payer: Self-pay | Admitting: Pulmonary Disease

## 2024-04-15 ENCOUNTER — Ambulatory Visit (INDEPENDENT_AMBULATORY_CARE_PROVIDER_SITE_OTHER): Admitting: Pulmonary Disease

## 2024-04-15 VITALS — BP 144/96 | HR 82 | Temp 98.2°F | Ht 66.0 in | Wt 168.4 lb

## 2024-04-15 DIAGNOSIS — R042 Hemoptysis: Secondary | ICD-10-CM

## 2024-04-15 NOTE — Patient Instructions (Addendum)
  VISIT SUMMARY: Today, we discussed your ongoing treatment for metastatic HER2-positive breast cancer, including your concerns about potential side effects of chemotherapy. We also reviewed your recent lung issues, including hemoptysis and bronchiectasis, as well as your history of vocal cord paralysis and acid reflux.  YOUR PLAN: -METASTATIC HER2-POSITIVE BREAST CANCER WITH PULMONARY AND OSSEOUS INVOLVEMENT: This is a type of breast cancer that has spread to your lungs and bones. You will continue your current treatment with trastuzumab and tucatinib. Please discuss any concerns about potential side effects of additional chemotherapy with your oncologist.  -HEMOPTYSIS SECONDARY TO METASTATIC LUNG DISEASE, STATUS POST BRONCHIAL ARTERY EMBOLIZATION (RESOLVED): Hemoptysis means coughing up blood, which in your case was due to cancer spreading to your lungs. This issue has been resolved after a procedure called bronchial artery embolization. If the bleeding happens again, we may need to repeat the procedure at [facility].  -RIGHT LOWER LOBE BRONCHIECTASIS: Bronchiectasis is a condition where the airways in your lungs become damaged and widened, often due to infections or chemotherapy. We will investigate the potential causes, including past infections and the effects of chemotherapy.  -UNILATERAL VOCAL CORD PARALYSIS, STATUS POST LARYNGITIS AND VOICE THERAPY: This means one of your vocal cords is paralyzed, likely due to chemotherapy, which has affected your voice. You have already undergone voice therapy for this issue.  -GASTROESOPHAGEAL REFLUX DISEASE: GERD is a condition where stomach acid frequently flows back into the tube connecting your mouth and stomach. Continue with your dietary modifications to manage your symptoms.  INSTRUCTIONS: Please follow up with your oncologist to discuss any concerns about chemotherapy side effects. If you experience hemoptysis again, consider a repeat embolization at  [facility]. Continue with your current dietary modifications to manage GERD symptoms.

## 2024-04-15 NOTE — Progress Notes (Signed)
 Maria Monroe    [id number]    1971/01/17  Primary Care Physician:Kalisetti, Lavenia, MD  Referring Physician: Raford Lenis, MD [address] [place],  [place] [address]  Chief complaint: Follow-up after recent hospitalization for hemoptysis  HPI: 53 y.o. who  has a past medical history of Arthritis, Asthma, Bone metastasis, Cancer (HCC), DVT (deep venous thrombosis) (HCC), H/O bilateral mastectomy, HER2-positive carcinoma of right breast (HCC), Hypertension, and Lymphedema of both arms.  Discussed the use of AI scribe software for clinical note transcription with the patient, who gave verbal consent to proceed.  History of Present Illness Maria Monroe is a 53 year old female with metastatic HER2 positive breast cancer who presents with lung issues, including hemoptysis.  Hemoptysis and pulmonary masses - Hemoptysis occurred recently, attributed to bronchiectasis or metastatic lung involvement from HER2 positive breast cancer. - Underwent two bronchoscopies and a bronchial artery embolization in July 2025 to control bleeding. - Prior imaging imaging revealed masses in the right paratracheal and left apical lung regions. - Bronchoscopy with navigational biopsy in March 2025 demonstrated malignant cells in the lungs. - Hospitalized in March 2025 for sepsis, during which lung changes were identified.  Metastatic breast cancer - Diagnosed with HER2 positive breast cancer in 2012, initially involving the right breast. - Prophylactic left mastectomy performed due to concern for malignancy. - Metastatic disease present in lungs and bones. - Current treatment includes trastuzumab and tucatinib. - Previous treatments included trastuzumab and Zemaira for bone strengthening.  Adverse reactions to medications - Developed an allergic reaction to prednisone , resulting in oral and pharyngeal thrush.  Laryngeal dysfunction and voice changes - History of vocal cord  paralysis and laryngitis attributed to chemotherapy. - Required voice therapy for rehabilitation.  Gastroesophageal reflux symptoms - Experiences acid reflux, managed with dietary modifications and avoidance of spicy foods.   Relevant Pulmonary history: Pets: No pets Occupation: On disability Exposures: No mold, hot tub, Jacuzzi.  No feather pillows or comforter No h/o chemo/XRT/amiodarone/macrodantin/MTX  No exposure to asbestos, silica or other organic allergens  Smoking history: Never smoker Travel history: No significant travel history Family history: No family history of lung disease   Outpatient Encounter Medications as of 04/15/2024  Medication Sig   azelastine  (ASTELIN ) 0.1 % nasal spray Place 2 sprays into both nostrils 4 (four) times daily as needed for rhinitis (or inflammation). Use in each nostril as directed   carvedilol  (COREG ) 25 MG tablet Take 25 mg by mouth 2 (two) times daily.   ipratropium-albuterol  (DUONEB) 0.5-2.5 (3) MG/3ML SOLN Take 3 mLs by nebulization every 6 (six) hours as needed.   Multiple Vitamin (MULTIVITAMIN) tablet Take 1 tablet by mouth daily.   oxyCODONE  (OXY IR/ROXICODONE ) 5 MG immediate release tablet Take 5 mg by mouth every 4 (four) hours as needed for severe pain.   sacubitril -valsartan  (ENTRESTO ) 24-26 MG Take 1 tablet by mouth 2 (two) times daily.   amoxicillin -clavulanate (AUGMENTIN ) 875-125 MG tablet Take 1 tablet by mouth every 12 (twelve) hours. (Patient not taking: Reported on 04/15/2024)   No facility-administered encounter medications on file as of 04/15/2024.     Physical Exam: Today's Vitals   04/15/24 0836  BP: (!) 144/96  Pulse: 82  Temp: 98.2 F (36.8 C)  TempSrc: Temporal  SpO2: 100%  Weight: 168 lb 6.4 oz (76.4 kg)  Height: 5' 6 (1.676 m)   Body mass index is 27.18 kg/m.  Physical Exam GEN: No acute distress CV: Regular rate and  rhythm no murmurs LUNGS: Clear to auscultation bilaterally normal respiratory  effort SKIN JOINTS: Warm and dry no rash    Data Reviewed: Imaging: CTA 03/19/2024- No pulmonary embolism, spiculated right upper lobe and perihilar mass, left upper lobe nodule, paratracheal lymphadenopathy, extensive bronchiectasis and cystic changes throughout the right mid and lower lung.  CT chest at [facility] 03/19/2024 1.  No active bleeding identified within the chest.  2.  Increased right basilar and middle lobe opacities new since 03/17/24,  which could reflect infection, aspiration, or hemorrhage.  3.  Stable enhancing soft tissue mass involving the right mediastinum and  medial right upper lobe. Unchanged left upper lobe metastasis.  4.  Similar marked cystic bronchiectasis involving the right lung,  predominantly the lower lobe.  5.  Narrowing of the of the left brachiocephalic vein and SVC with multiple  collaterals chest.     PFTs:  Labs: Assessment & Plan Metastatic HER2-positive breast cancer with pulmonary and osseous involvement HER2-positive breast cancer with metastasis to the lungs and bones. Currently being treated with trastuzumab and tucatinib. She expresses concern about potential cardiac side effects of the third chemotherapy component and is hesitant to proceed with it. - Continue trastuzumab and tucatinib as per oncologist's recommendation - Discuss concerns about chemotherapy side effects with oncologist  Hemoptysis secondary to metastatic lung disease, status post bronchial artery embolization (resolved) Hemoptysis due to metastatic lung disease, resolved after bronchoscopy and bronchial artery embolization performed in July at [facility]. No further episodes of bleeding reported.  - If hemoptysis recurs, consider repeat embolization at [facility] as patient gets all of her care there  Right lower lobe bronchiectasis Bronchiectasis noted in the right lower lobe, possibly due to past infection or effects of chemotherapy.    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 Seen by ENT and finished speech therapy.  Gastroesophageal reflux disease GERD symptoms managed by dietary modifications. She avoids spicy foods to mitigate symptoms. - Continue dietary modifications to manage GERD symptoms  Recommendations: Follow-up in 6 months  Lonna Coder MD [facility] 04/15/2024, 9:04 AM  CC: Raford Lenis, MD

## 2024-11-15 ENCOUNTER — Ambulatory Visit: Admitting: Primary Care
